# Patient Record
Sex: Male | Born: 1950 | Race: White | Hispanic: No | Marital: Married | Smoking: Never smoker
Health system: Southern US, Community
[De-identification: ages and names within clinical notes are randomized; demographics above are authoritative.]

## PROBLEM LIST (undated history)

## (undated) DIAGNOSIS — N183 Chronic kidney disease, stage 3 unspecified: Secondary | ICD-10-CM

## (undated) DIAGNOSIS — K760 Fatty (change of) liver, not elsewhere classified: Secondary | ICD-10-CM

## (undated) DIAGNOSIS — I4891 Unspecified atrial fibrillation: Secondary | ICD-10-CM

## (undated) DIAGNOSIS — I10 Essential (primary) hypertension: Secondary | ICD-10-CM

## (undated) DIAGNOSIS — E785 Hyperlipidemia, unspecified: Secondary | ICD-10-CM

## (undated) DIAGNOSIS — Z974 Presence of external hearing-aid: Secondary | ICD-10-CM

## (undated) DIAGNOSIS — G4733 Obstructive sleep apnea (adult) (pediatric): Secondary | ICD-10-CM

## (undated) DIAGNOSIS — E119 Type 2 diabetes mellitus without complications: Secondary | ICD-10-CM

## (undated) DIAGNOSIS — G4730 Sleep apnea, unspecified: Secondary | ICD-10-CM

## (undated) HISTORY — DX: Unspecified atrial fibrillation: I48.91

## (undated) HISTORY — PX: COLONOSCOPY: SHX174

## (undated) HISTORY — PX: VASECTOMY: SHX75

## (undated) HISTORY — DX: Sleep apnea, unspecified: G47.30

## (undated) HISTORY — DX: Hyperlipidemia, unspecified: E78.5

## (undated) HISTORY — PX: TONSILLECTOMY: SUR1361

## (undated) HISTORY — DX: Essential (primary) hypertension: I10

---

## 2003-12-15 ENCOUNTER — Ambulatory Visit (HOSPITAL_COMMUNITY): Admission: RE | Admit: 2003-12-15 | Discharge: 2003-12-15 | Payer: Self-pay | Admitting: Gastroenterology

## 2011-08-22 ENCOUNTER — Ambulatory Visit: Payer: Self-pay | Admitting: Internal Medicine

## 2011-10-12 ENCOUNTER — Encounter: Payer: Self-pay | Admitting: *Deleted

## 2011-10-12 NOTE — Telephone Encounter (Signed)
This encounter was created in error - please disregard.

## 2014-02-23 ENCOUNTER — Ambulatory Visit: Payer: Self-pay | Admitting: Gastroenterology

## 2014-02-24 LAB — PATHOLOGY REPORT

## 2017-01-29 NOTE — Progress Notes (Signed)
01/30/2017 2:12 PM   Nicholas Oliver. 1951/01/06 [id number]  Referring provider: No referring provider defined for this encounter.  Chief Complaint  Patient presents with  . New Patient (Initial Visit)    Hematuria referred by [name] PA    HPI: Patient is a 66 -year-old Caucasian male who presents today as a referral from their PCP, [name], PA , for gross hematuria.    Patient had one episode of gross hematuria at the end of August.      He does have a prior history of recurrent urinary tract infections, but he denies a history of nephrolithiasis, trauma to the genitourinary tract, BPH or malignancies of the genitourinary tract.  He does not have a family medical history of nephrolithiasis, malignancies of the genitourinary tract or hematuria.   Today, he is having symptoms of nocturia.  His UA today is negative.  He is not experiencing any suprapubic pain, abdominal pain or flank pain. He denies any recent fevers, chills, nausea or vomiting.   He has not had any recent imaging.      He is not a smoker.  He was exposed to second hand smoke as a child.  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   He has HTN.     He has a high BMI.     PMH: Past Medical History:  Diagnosis Date  . Atrial fibrillation (HCC)   . HLD (hyperlipidemia)   . HTN (hypertension)   . Sleep apnea     Surgical History: Past Surgical History:  Procedure Laterality Date  . VASECTOMY      Home Medications:  Allergies as of 01/30/2017   No Known Allergies     Medication List       Accurate as of 01/30/17  2:12 PM. Always use your most recent med list.          CO Q 10 PO Take by mouth daily.   lisinopril-hydrochlorothiazide 10-12.5 MG tablet Commonly known as:  PRINZIDE,ZESTORETIC   metoprolol succinate 100 MG 24 hr tablet Commonly known as:  TOPROL-XL Take by mouth.   rosuvastatin 10 MG tablet Commonly known as:  CRESTOR TAKE 1 TABLET  AT BEDTIME   XARELTO 20 MG Tabs tablet Generic drug:  rivaroxaban            Discharge Care Instructions        Start     Ordered   01/30/17 0000  Urinalysis, Complete     01/30/17 1341   01/30/17 0000  CULTURE, URINE COMPREHENSIVE     01/30/17 1341   01/30/17 0000  BUN+Creat     01/30/17 1341   01/30/17 0000  CT HEMATURIA WORKUP    Question Answer Comment  Reason for Exam (SYMPTOM  OR DIAGNOSIS REQUIRED) gross hematuria   Preferred imaging location? [facility]      01/30/17 1409      Allergies: No Known Allergies  Family History: Family History  Problem Relation Age of Onset  . Prostate cancer Neg Hx   . Kidney cancer Neg Hx   . Bladder Cancer Neg Hx     Social History:  reports that he has never smoked. He has never used smokeless tobacco. He reports that he does not drink alcohol or use drugs.  ROS: UROLOGY Frequent Urination?: No Hard to postpone urination?: No Burning/pain with urination?: No Get up at night to urinate?: Yes Leakage of urine?: No Urine stream starts and stops?:  No Trouble starting stream?: No Do you have to strain to urinate?: No Blood in urine?: Yes Urinary tract infection?: Yes Sexually transmitted disease?: No Injury to kidneys or bladder?: No Painful intercourse?: No Weak stream?: No Erection problems?: Yes Penile pain?: No  Gastrointestinal Nausea?: No Vomiting?: No Indigestion/heartburn?: No Diarrhea?: No Constipation?: No  Constitutional Fever: No Night sweats?: No Weight loss?: No Fatigue?: No  Skin Skin rash/lesions?: No Itching?: No  Eyes Blurred vision?: No Double vision?: No  Ears/Nose/Throat Sore throat?: No Sinus problems?: No  Hematologic/Lymphatic Swollen glands?: No Easy bruising?: No  Cardiovascular Leg swelling?: No Chest pain?: No  Respiratory Cough?: No Shortness of breath?: No  Endocrine Excessive thirst?: No  Musculoskeletal Back pain?: Yes Joint pain?:  No  Neurological Headaches?: No Dizziness?: No  Psychologic Depression?: No Anxiety?: No  Physical Exam: BP (!) 146/75   Pulse 73   Ht 6' (1.829 m)   Wt (!) 321 lb 6.4 oz (145.8 kg)   BMI 43.59 kg/m   Constitutional: Well nourished. Alert and oriented, No acute distress. HEENT: [place] AT, moist mucus membranes. Trachea midline, no masses. Cardiovascular: No clubbing, cyanosis, or edema. Respiratory: Normal respiratory effort, no increased work of breathing. GI: Abdomen is soft, non tender, non distended, no abdominal masses. Liver and spleen not palpable.  No hernias appreciated.  Stool sample for occult testing is not indicated.   GU: No CVA tenderness.  No bladder fullness or masses.  Patient with buried phallus.  Urethral meatus is patent.  No penile discharge. No penile lesions or rashes. Scrotum without lesions, cysts, rashes and/or edema.  Testicles are located scrotally bilaterally. No masses are appreciated in the testicles. Left and right epididymis are normal. Rectal: Patient with  normal sphincter tone. Anus and perineum without scarring or rashes. No rectal masses are appreciated. Prostate is approximately 45 grams, no nodules are appreciated. Seminal vesicles are normal. Skin: No rashes, bruises or suspicious lesions. Lymph: No cervical or inguinal adenopathy. Neurologic: Grossly intact, no focal deficits, moving all 4 extremities. Psychiatric: Normal mood and affect.  Laboratory Data: PSA history  0.58 in September 2015  0.52 in September 2016  3 urine cultures positive for Escherichia coli since May 2018.     Urinalysis Negative.  See EPIC.    I have reviewed the labs   Assessment & Plan:    1. Gross hematuria  - I explained to the patient that there are a number of causes that can be associated with blood in the urine, such as stones, BPH, UTI's, damage to the urinary tract and/or cancer.  - At this time, I felt that the patient warranted further urologic  evaluation.   The AUA guidelines state that a CT urogram is the preferred imaging study to evaluate hematuria.  - I explained to the patient that a contrast material will be injected into a vein and that in rare instances, an allergic reaction can result and may even life threatening   The patient denies any allergies to contrast, iodine and/or seafood and is not taking metformin.  - Following the imaging study,  I've recommended a cystoscopy. I described how this is performed, typically in an office setting with a flexible cystoscope. We described the risks, benefits, and possible side effects, the most common of which is a minor amount of blood in the urine and/or burning which usually resolves in 24 to 48 hours.    - The patient had the opportunity to ask questions which were answered. Based upon  this discussion, the patient is willing to proceed. Therefore, I've ordered: a CT Urogram and cystoscopy.  - The patient will return following all of the above for discussion of the results.   - UA  - Urine culture  - BUN + creatinine    2. BPH with LU TS  - PSA  3. History of recurrent UTI's  - asymptomatic at the time of the gross hematuria  - CTU and cystoscopy pending ? Nidus for infection  Return for CT Urogram report and cystoscopy.  These notes generated with voice recognition software. I apologize for typographical errors.  Michiel Cowboy, PA-C  [facility] [address], [address] [place], [place] [address] [phone]

## 2017-01-30 ENCOUNTER — Ambulatory Visit (INDEPENDENT_AMBULATORY_CARE_PROVIDER_SITE_OTHER): Admitting: Urology

## 2017-01-30 ENCOUNTER — Encounter: Payer: Self-pay | Admitting: Urology

## 2017-01-30 ENCOUNTER — Ambulatory Visit: Payer: Self-pay | Admitting: Urology

## 2017-01-30 VITALS — BP 146/75 | HR 73 | Ht 72.0 in | Wt 321.4 lb

## 2017-01-30 DIAGNOSIS — Z87440 Personal history of urinary (tract) infections: Secondary | ICD-10-CM

## 2017-01-30 DIAGNOSIS — N401 Enlarged prostate with lower urinary tract symptoms: Secondary | ICD-10-CM | POA: Diagnosis not present

## 2017-01-30 DIAGNOSIS — R310 Gross hematuria: Secondary | ICD-10-CM

## 2017-01-30 DIAGNOSIS — N138 Other obstructive and reflux uropathy: Secondary | ICD-10-CM

## 2017-01-30 LAB — URINALYSIS, COMPLETE
BILIRUBIN UA: NEGATIVE
Glucose, UA: NEGATIVE
Ketones, UA: NEGATIVE
Leukocytes, UA: NEGATIVE
NITRITE UA: NEGATIVE
PH UA: 6.5 (ref 5.0–7.5)
Protein, UA: NEGATIVE
Specific Gravity, UA: 1.02 (ref 1.005–1.030)
UUROB: 0.2 mg/dL (ref 0.2–1.0)

## 2017-01-30 LAB — MICROSCOPIC EXAMINATION: WBC UA: NONE SEEN /HPF (ref 0–?)

## 2017-02-01 LAB — BUN+CREAT
BUN/Creatinine Ratio: 12 (ref 10–24)
BUN: 12 mg/dL (ref 8–27)
Creatinine, Ser: 0.97 mg/dL (ref 0.76–1.27)
GFR calc non Af Amer: 82 mL/min/{1.73_m2} (ref >59)
GFR, EST AFRICAN AMERICAN: 95 mL/min/{1.73_m2} (ref >59)

## 2017-02-01 LAB — PSA: Prostate Specific Ag, Serum: 0.8 ng/mL (ref 0.0–4.0)

## 2017-02-02 LAB — CULTURE, URINE COMPREHENSIVE

## 2017-02-04 ENCOUNTER — Telehealth: Payer: Self-pay

## 2017-02-04 NOTE — Telephone Encounter (Signed)
Pt returned call and I told him his PSA was normal.

## 2017-02-04 NOTE — Telephone Encounter (Signed)
-----   Message from Harle Battiest, PA-C sent at 02/04/2017  7:53 AM EDT ----- Please let Mr. Bonura know that his PSA was normal.

## 2017-02-04 NOTE — Telephone Encounter (Signed)
Left pt mess 

## 2017-02-18 ENCOUNTER — Ambulatory Visit
Admission: RE | Admit: 2017-02-18 | Discharge: 2017-02-18 | Disposition: A | Discharge status: Home / Self Care | Source: Ambulatory Visit | Attending: Urology | Admitting: Urology

## 2017-02-18 DIAGNOSIS — R310 Gross hematuria: Secondary | ICD-10-CM | POA: Diagnosis present

## 2017-02-18 DIAGNOSIS — I700 Atherosclerosis of aorta: Secondary | ICD-10-CM | POA: Insufficient documentation

## 2017-02-18 DIAGNOSIS — K760 Fatty (change of) liver, not elsewhere classified: Secondary | ICD-10-CM | POA: Insufficient documentation

## 2017-02-18 DIAGNOSIS — I2510 Atherosclerotic heart disease of native coronary artery without angina pectoris: Secondary | ICD-10-CM | POA: Diagnosis not present

## 2017-02-18 MED ORDER — IOPAMIDOL (ISOVUE-300) INJECTION 61%
125.0000 mL | Freq: Once | INTRAVENOUS | Status: AC | PRN
  Administered 2017-02-18: 125 mL via INTRAVENOUS

## 2017-02-27 ENCOUNTER — Ambulatory Visit (INDEPENDENT_AMBULATORY_CARE_PROVIDER_SITE_OTHER): Admitting: Urology

## 2017-02-27 ENCOUNTER — Encounter: Payer: Self-pay | Admitting: Urology

## 2017-02-27 VITALS — BP 144/79 | HR 64 | Ht 72.0 in | Wt 316.9 lb

## 2017-02-27 DIAGNOSIS — R310 Gross hematuria: Secondary | ICD-10-CM

## 2017-02-27 LAB — URINALYSIS, COMPLETE
Bilirubin, UA: NEGATIVE
Glucose, UA: NEGATIVE
Ketones, UA: NEGATIVE
LEUKOCYTES UA: NEGATIVE
Nitrite, UA: NEGATIVE
PH UA: 5 (ref 5.0–7.5)
PROTEIN UA: NEGATIVE
SPEC GRAV UA: 1.025 (ref 1.005–1.030)
Urobilinogen, Ur: 0.2 mg/dL (ref 0.2–1.0)

## 2017-02-27 MED ORDER — LIDOCAINE HCL 2 % EX GEL
1.0000 "application " | Freq: Once | CUTANEOUS | Status: AC
  Administered 2017-02-27: 1 via URETHRAL

## 2017-02-27 MED ORDER — CIPROFLOXACIN HCL 500 MG PO TABS
500.0000 mg | ORAL_TABLET | Freq: Once | ORAL | Status: AC
  Administered 2017-02-27: 500 mg via ORAL

## 2017-02-27 NOTE — Progress Notes (Signed)
   02/27/17  CC: No chief complaint on file.   HPI: The patient is a 66 year old gentleman who presents today for completion of his gross hematuria workup. He had one episode of gross hematuria in August 2018. His CT was unremarkable for source of hematuria.  There were no vitals taken for this visit. NED. A&Ox3.   No respiratory distress   Abd soft, NT, ND Normal phallus with bilateral descended testicles  Cystoscopy Procedure Note  Patient identification was confirmed, informed consent was obtained, and patient was prepped using Betadine solution.  Lidocaine jelly was administered per urethral meatus.    Preoperative abx where received prior to procedure.     Pre-Procedure: - Inspection reveals a normal caliber ureteral meatus.  Procedure: The flexible cystoscope was introduced without difficulty - No urethral strictures/lesions are present. - Enlarged prostate Hypervascular. Visually obstructed. Approximate 4-5 cm in length. - Normal bladder neck - Bilateral ureteral orifices identified - Bladder mucosa  reveals no ulcers, tumors, or lesions - No bladder stones - No trabeculation  Retroflexion shows no intravesical lobe   Post-Procedure: - Patient tolerated the procedure well  Assessment/ Plan:  1.  Gross hematuria -negative workup. Likely secondary to a hypervascular prostate. If this becomes a recurrent, can consider a trial of finasteride. Follow-up in one year for repeat urinalysis.

## 2017-03-18 ENCOUNTER — Telehealth: Payer: Self-pay | Admitting: Urology

## 2017-03-18 NOTE — Telephone Encounter (Signed)
Spoke with Nicholas Oliver and made him aware of the CT findings of possible cirrhosis.  He will speak with his PCP regarding this finding.

## 2018-02-27 ENCOUNTER — Ambulatory Visit: Admitting: Urology

## 2018-06-04 IMAGING — CT CT ABD-PEL WO/W CM
2 of 6 series · 13 of 32 positions shown, 18 images · IV contrast (APPLIED)
Comparison: None.

CLINICAL DATA: Gross hematuria.

EXAM:
CT ABDOMEN AND PELVIS WITHOUT AND WITH CONTRAST
TECHNIQUE: Multidetector CT imaging of the abdomen and pelvis was performed
following the standard protocol before and following the bolus
administration of intravenous contrast.
CONTRAST:  125mL WPPSNU-DTT IOPAMIDOL (WPPSNU-DTT) INJECTION 61%

[Series 2: axial pre · axial · non-contrast · 0.98mm/px · z∈[-931,-541]mm · 7 of 106 slices shown]
[im 14/106  soft-tissue]
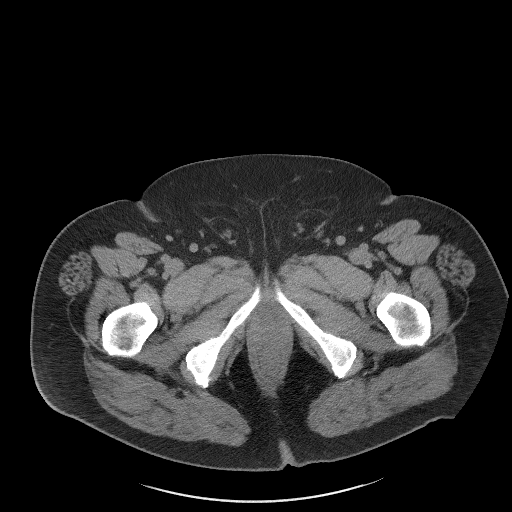
[im 27/106  soft-tissue]
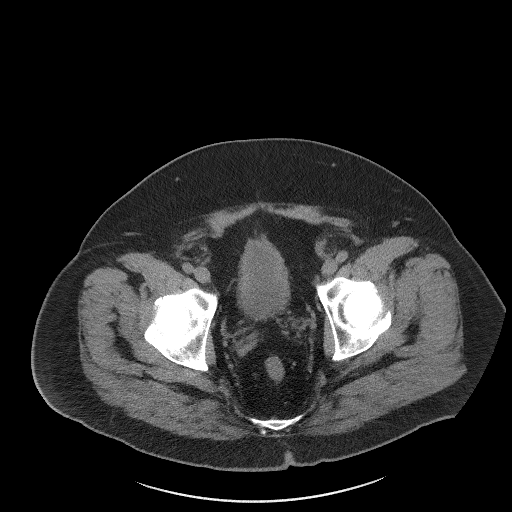
[im 40/106  soft-tissue]
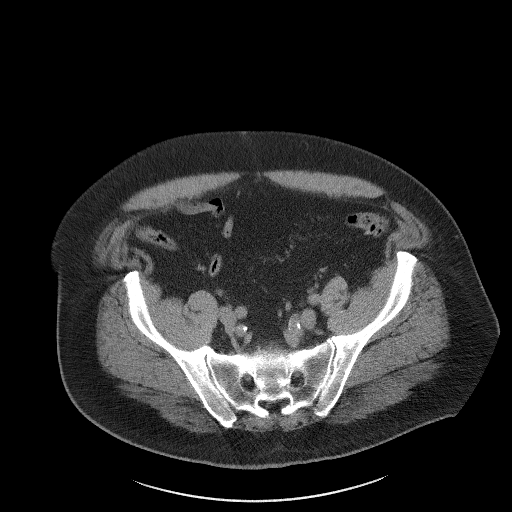
[im 53/106  soft-tissue]
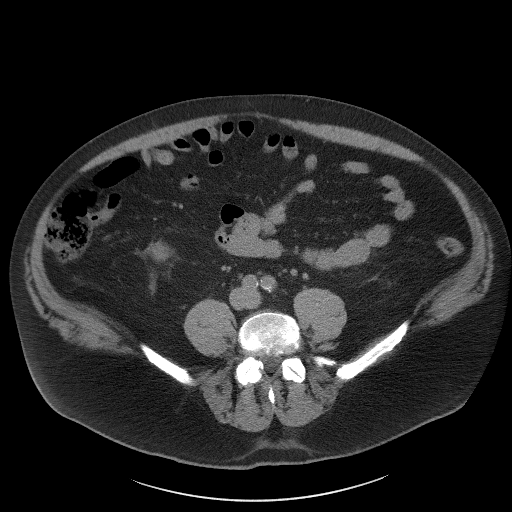
[im 66/106  soft-tissue]
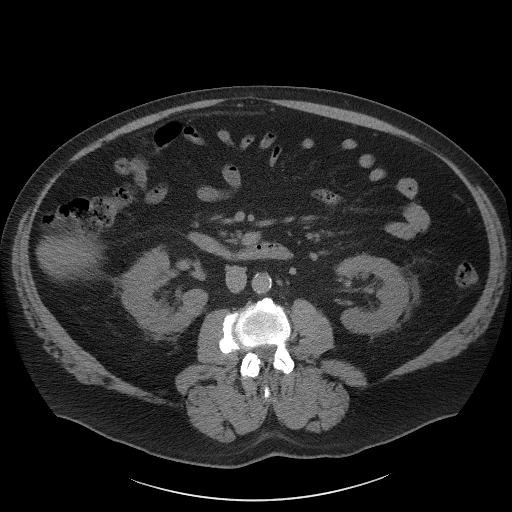
[im 79/106  soft-tissue]
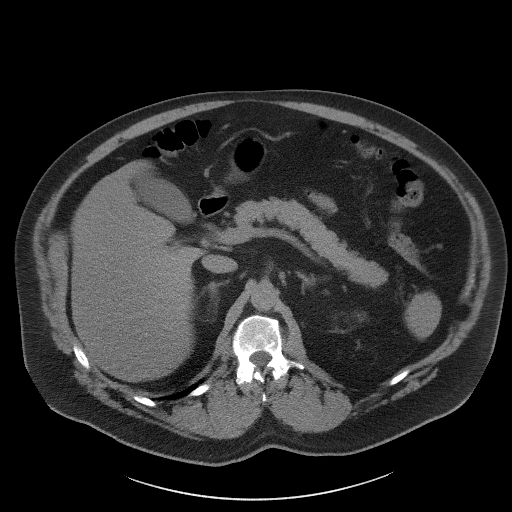
[im 92/106  soft-tissue]
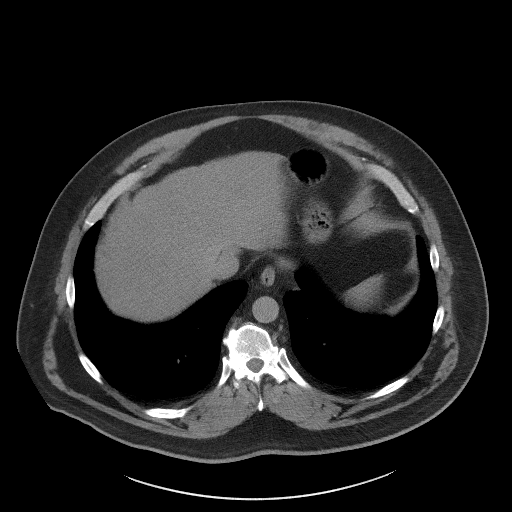

[Series 13: axial delay · axial · delayed · 0.98mm/px · z∈[-998,-593]mm · 6 of 115 slices shown, 11 images]
[im 17/115  soft-tissue]
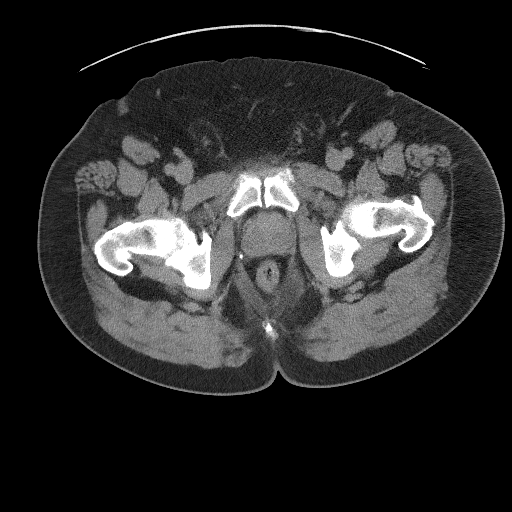
[im 17/115  bone]
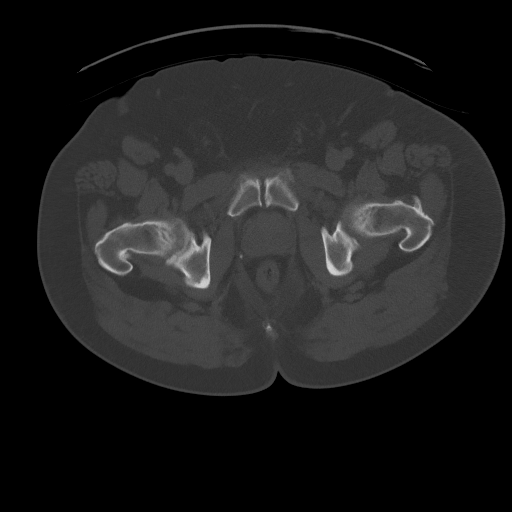
[im 33/115  soft-tissue]
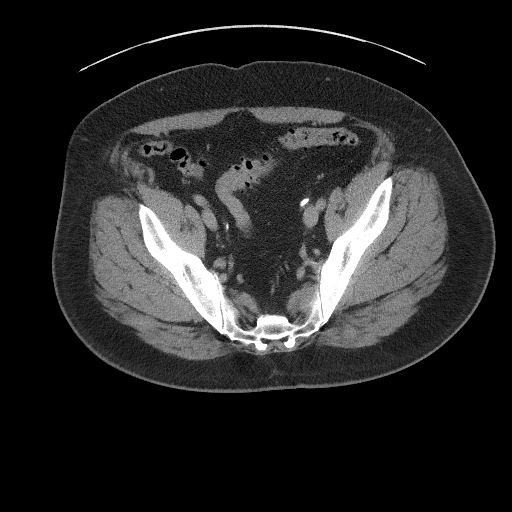
[im 49/115  soft-tissue]
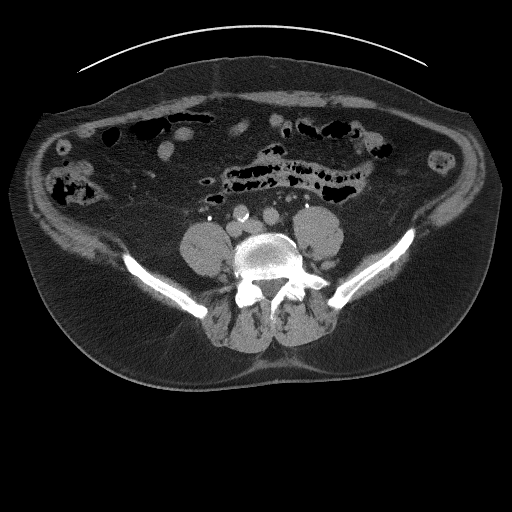
[im 49/115  lung]
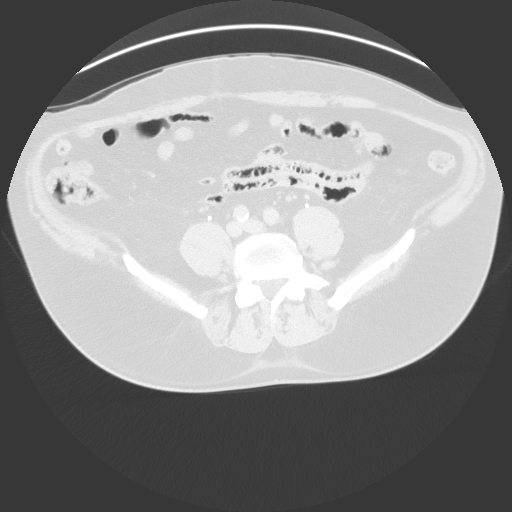
[im 66/115  soft-tissue]
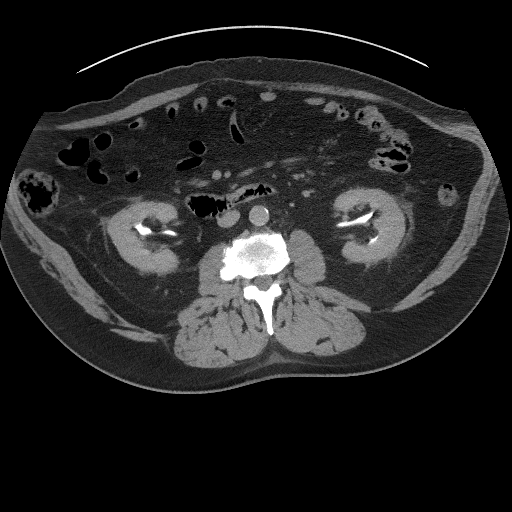
[im 66/115  lung]
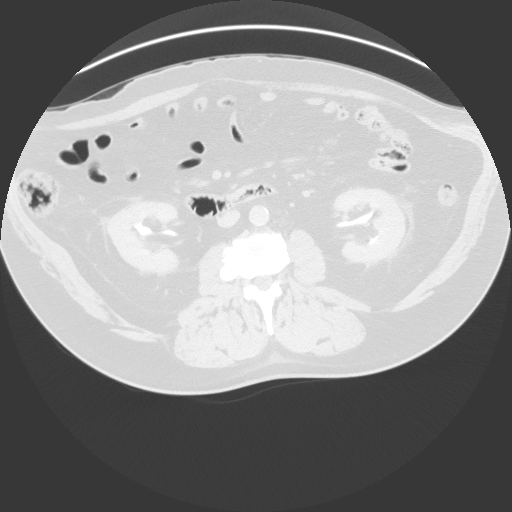
[im 82/115  soft-tissue]
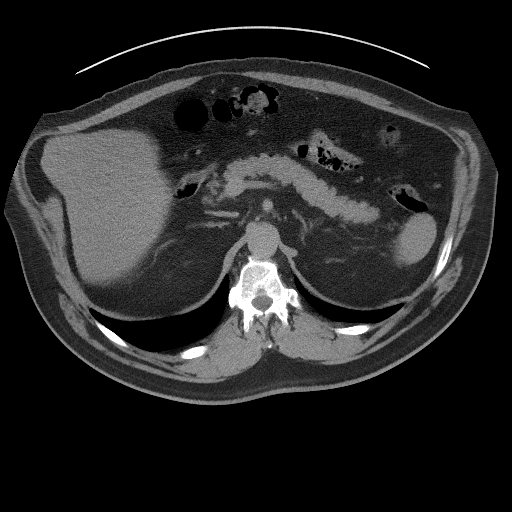
[im 82/115  lung]
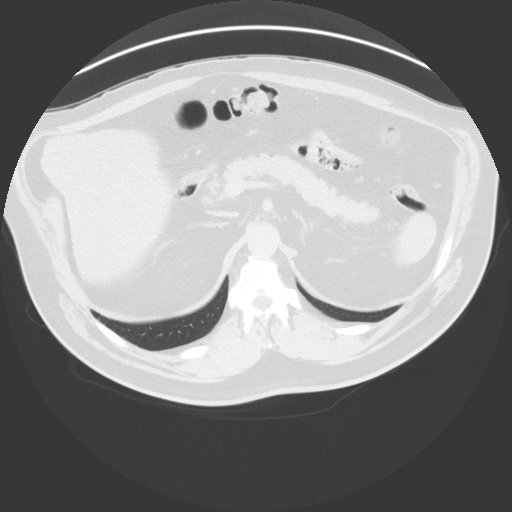
[im 98/115  soft-tissue]
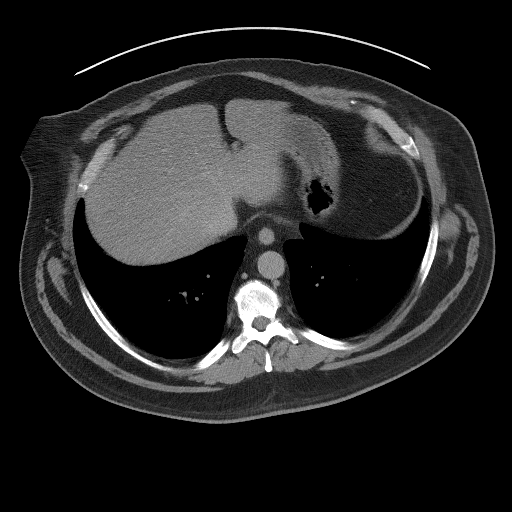
[im 98/115  lung]
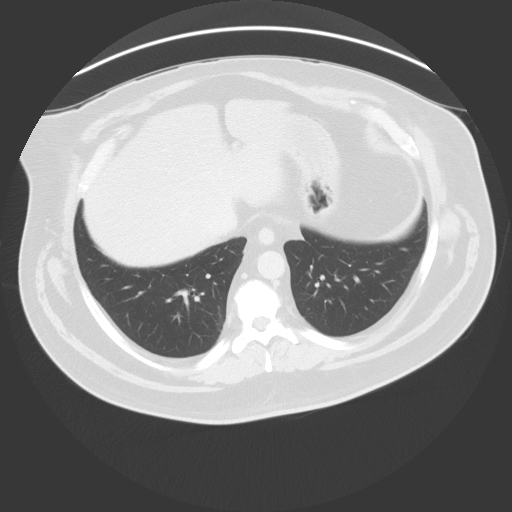

[13 of 32 positions shown; findings below may reference images not displayed]

FINDINGS: Lower chest: 2 mm left lower lobe nodule nonspecific. Coronary
artery calcification. Heart size normal. No pericardial or pleural
effusion.

Hepatobiliary: Liver appears slightly decreased in attenuation.
Liver contour is somewhat irregular. Subcentimeter low-attenuation
lesion in the right hepatic lobe is too small to characterize.
Gallbladder is unremarkable. No biliary ductal dilatation.

Pancreas: Negative.

Spleen: Negative.

Adrenals/Urinary Tract: Adrenal glands and kidneys are unremarkable.
No urinary stones. No filling defects in the intrarenal collecting
systems, ureters or bladder.

Stomach/Bowel: Stomach, small bowel and colon are unremarkable.
Appendix is not readily visualized.

Vascular/Lymphatic: Atherosclerotic calcification of the arterial
vasculature without abdominal aortic aneurysm. Porta hepatis lymph
node measures 1.5 cm, likely reactive.

Reproductive: Prostate is visualized.

Other: Small bilateral inguinal hernias contain fat. No free fluid.
Mesenteries and peritoneum are unremarkable.

Musculoskeletal: Degenerative changes in the spine. No worrisome
lytic or sclerotic lesions.
IMPRESSION: 1. No findings to explain the patient's hematuria.
2. Fatty liver.  Contour irregularity is indicative of cirrhosis.
3. Aortic atherosclerosis ([id number]). Coronary artery
calcification.

## 2018-12-16 ENCOUNTER — Other Ambulatory Visit: Payer: Self-pay

## 2018-12-16 ENCOUNTER — Encounter: Payer: Medicare Other | Attending: Internal Medicine | Admitting: *Deleted

## 2018-12-16 ENCOUNTER — Encounter: Payer: Self-pay | Admitting: *Deleted

## 2018-12-16 VITALS — BP 110/70 | Ht 72.0 in | Wt 284.2 lb

## 2018-12-16 DIAGNOSIS — E1165 Type 2 diabetes mellitus with hyperglycemia: Secondary | ICD-10-CM

## 2018-12-16 DIAGNOSIS — E119 Type 2 diabetes mellitus without complications: Secondary | ICD-10-CM | POA: Insufficient documentation

## 2018-12-16 NOTE — Progress Notes (Signed)
Diabetes Self-Management Education  Visit Type: First/Initial  Appt. Start Time: 0905 Appt. End Time: 5366  12/16/2018  Mr. Nicholas Oliver, identified by name and date of birth, is a 68 y.o. male with a diagnosis of Diabetes: Type 2.   ASSESSMENT  Blood pressure 110/70, height 6' (1.829 m), weight 284 lb 3.2 oz (128.9 kg). Body mass index is 38.54 kg/m.  Diabetes Self-Management Education - 12/16/18 1054      Visit Information   Visit Type  First/Initial      Initial Visit   Diabetes Type  Type 2    Are you currently following a meal plan?  No    Are you taking your medications as prescribed?  Yes   waiting on pen needles for Lantus and Metformin from [place].   Date Diagnosed  Pt reports "1 week".A1C was 7.5 % in 2018      Health Coping   How would you rate your overall health?  Good      Psychosocial Assessment   Patient Belief/Attitude about Diabetes  Nicholas (comment)   "I don't want to take any more medication."   Self-care barriers  None    Self-management support  Doctor's office;Family    Patient Concerns  Nutrition/Meal planning;Glycemic Control    Special Needs  None    Preferred Learning Style  Auditory    Learning Readiness  Ready    How often do you need to have someone help you when you read instructions, pamphlets, or Nicholas written materials from your doctor or pharmacy?  1 - Never    What is the last grade level you completed in school?  doctorate      Pre-Education Assessment   Patient understands the diabetes disease and treatment process.  Needs Instruction    Patient understands incorporating nutritional management into lifestyle.  Needs Instruction    Patient undertands incorporating physical activity into lifestyle.  Needs Instruction    Patient understands using medications safely.  Needs Instruction    Patient understands monitoring blood glucose, interpreting and using results  Needs Instruction    Patient understands prevention, detection,  and treatment of acute complications.  Needs Instruction    Patient understands prevention, detection, and treatment of chronic complications.  Needs Instruction    Patient understands how to develop strategies to address psychosocial issues.  Needs Instruction    Patient understands how to develop strategies to promote health/change behavior.  Needs Instruction      Complications   Last HgB A1C per patient/outside source  13.8 %   12/01/18   How often do you check your blood sugar?  0 times/day (not testing)   Pt has FreeStyle Libre at home but hasn't started using. BG in the office today was 401 mg/dL at 10:25 am - 2 hrs pp. He will be starting Insulin today.   Have you had a dilated eye exam in the past 12 months?  Yes    Have you had a dental exam in the past 12 months?  Yes    Are you checking your feet?  Yes    How many days per week are you checking your feet?  6      Dietary Intake   Breakfast  English Muffin with butter or preserves; ham biscuit; eggs and bacon    Lunch  sandwich, soup, salmon and sweet potato, pizza, left overs, fruit, meatball sub, chicken sandwich - beef, chicken, beans, broccoli, salads    Dinner  small - fruit  Snack (evening)  ice cream    Beverage(s)  water, juice, sweet tea once a week      Exercise   Exercise Type  Light (walking / raking leaves)    How many days per week to you exercise?  5    How many minutes per day do you exercise?  30    Total minutes per week of exercise  150      Patient Education   Previous Diabetes Education  No    Disease state   Definition of diabetes, type 1 and 2, and the diagnosis of diabetes;Factors that contribute to the development of diabetes    Nutrition management   Role of diet in the treatment of diabetes and the relationship between the three main macronutrients and blood glucose level;Reviewed blood glucose goals for pre and post meals and how to evaluate the patients' food intake on their blood glucose level.     Physical activity and exercise   Role of exercise on diabetes management, blood pressure control and cardiac health.    Medications  Taught/reviewed insulin injection, site rotation, insulin storage and needle disposal.;Reviewed patients medication for diabetes, action, purpose, timing of dose and side effects. Pt injected 5 units NS subcutaneously via syringe to left abdomen without difficulty.    Monitoring  Purpose and frequency of SMBG.;Taught/discussed recording of test results and interpretation of SMBG.;Identified appropriate SMBG and/or A1C goals.    Acute complications  Taught treatment of hypoglycemia - the 15 rule.    Chronic complications  Relationship between chronic complications and blood glucose control    Psychosocial adjustment  Identified and addressed patients feelings and concerns about diabetes      Individualized Goals (developed by patient)   Reducing Risk  Improve blood sugars     Outcomes   Expected Outcomes  Demonstrated interest in learning. Expect positive outcomes    Program Status  Not Completed       Individualized Plan for Diabetes Self-Management Training:   Learning Objective:  Patient will have a greater understanding of diabetes self-management. Patient education plan is to attend individual and/or group sessions per assessed needs and concerns.   Plan:   Patient Instructions  Check blood sugars at least  2 x day before breakfast and 2 hrs after one meal every day or as needed with FreeStyle Libre Exercise: Continue walking for 30 minutes 5  days a week Eat 3 meals day, 1-2 snacks a day Space meals 4-6 hours apart Avoid sugar sweetened drinks (tea, juices) unless treating a low blood sugar Carry fast acting glucose and a snack at all times Rotate injection sites Call back if you want to schedule classes  Expected Outcomes:  Demonstrated interest in learning. Expect positive outcomes  Education material provided:  General Meal Planning  Guidelines Simple Meal Plan BD Insulin start kit with pen needles Glucose tablets Symptoms, causes and treatments of Hypoglycemia  If problems or questions, patient to contact team via:  Nicholas Oliver, [place], [place], CDE [phone]  Future DSME appointment:  Patient doesn't want to schedule any further education at this time.

## 2018-12-16 NOTE — Patient Instructions (Signed)
Check blood sugars at least  2 x day before breakfast and 2 hrs after one meal every day or as needed with FreeStyle Libre  Exercise: Continue walking for 30 minutes 5  days a week  Eat 3 meals day, 1-2 snacks a day Space meals 4-6 hours apart Avoid sugar sweetened drinks (tea, juices) unless treating a low blood sugar  Carry fast acting glucose and a snack at all times Rotate injection sites  Call back if you want to schedule classes

## 2019-04-24 ENCOUNTER — Other Ambulatory Visit
Admission: RE | Admit: 2019-04-24 | Discharge: 2019-04-24 | Disposition: A | Discharge status: Home / Self Care | Payer: Medicare Other | Source: Ambulatory Visit | Attending: Internal Medicine | Admitting: Internal Medicine

## 2019-04-24 DIAGNOSIS — Z01812 Encounter for preprocedural laboratory examination: Secondary | ICD-10-CM | POA: Insufficient documentation

## 2019-04-24 DIAGNOSIS — Z20828 Contact with and (suspected) exposure to other viral communicable diseases: Secondary | ICD-10-CM | POA: Insufficient documentation

## 2019-04-24 LAB — SARS CORONAVIRUS 2 (TAT 6-24 HRS): SARS Coronavirus 2: NEGATIVE

## 2019-04-28 ENCOUNTER — Encounter: Payer: Self-pay | Admitting: Internal Medicine

## 2019-04-29 ENCOUNTER — Encounter
Admission: RE | Disposition: A | Discharge status: Home / Self Care | Payer: Self-pay | Source: Home / Self Care | Attending: Internal Medicine

## 2019-04-29 ENCOUNTER — Ambulatory Visit: Payer: Medicare Other | Admitting: Anesthesiology

## 2019-04-29 ENCOUNTER — Encounter: Payer: Self-pay | Admitting: Internal Medicine

## 2019-04-29 ENCOUNTER — Ambulatory Visit
Admission: RE | Admit: 2019-04-29 | Discharge: 2019-04-29 | Disposition: A | Discharge status: Home / Self Care | Payer: Medicare Other | Attending: Internal Medicine | Admitting: Internal Medicine

## 2019-04-29 DIAGNOSIS — Z8371 Family history of colonic polyps: Secondary | ICD-10-CM | POA: Insufficient documentation

## 2019-04-29 DIAGNOSIS — I10 Essential (primary) hypertension: Secondary | ICD-10-CM | POA: Diagnosis not present

## 2019-04-29 DIAGNOSIS — Z794 Long term (current) use of insulin: Secondary | ICD-10-CM | POA: Diagnosis not present

## 2019-04-29 DIAGNOSIS — E785 Hyperlipidemia, unspecified: Secondary | ICD-10-CM | POA: Insufficient documentation

## 2019-04-29 DIAGNOSIS — I4891 Unspecified atrial fibrillation: Secondary | ICD-10-CM | POA: Insufficient documentation

## 2019-04-29 DIAGNOSIS — Z79899 Other long term (current) drug therapy: Secondary | ICD-10-CM | POA: Insufficient documentation

## 2019-04-29 DIAGNOSIS — K621 Rectal polyp: Secondary | ICD-10-CM | POA: Diagnosis not present

## 2019-04-29 DIAGNOSIS — Z1211 Encounter for screening for malignant neoplasm of colon: Secondary | ICD-10-CM | POA: Insufficient documentation

## 2019-04-29 DIAGNOSIS — G4730 Sleep apnea, unspecified: Secondary | ICD-10-CM | POA: Insufficient documentation

## 2019-04-29 DIAGNOSIS — K640 First degree hemorrhoids: Secondary | ICD-10-CM | POA: Diagnosis not present

## 2019-04-29 DIAGNOSIS — K5730 Diverticulosis of large intestine without perforation or abscess without bleeding: Secondary | ICD-10-CM | POA: Insufficient documentation

## 2019-04-29 DIAGNOSIS — Z7901 Long term (current) use of anticoagulants: Secondary | ICD-10-CM | POA: Diagnosis not present

## 2019-04-29 DIAGNOSIS — E119 Type 2 diabetes mellitus without complications: Secondary | ICD-10-CM | POA: Insufficient documentation

## 2019-04-29 HISTORY — DX: Type 2 diabetes mellitus without complications: E11.9

## 2019-04-29 HISTORY — PX: COLONOSCOPY WITH PROPOFOL: SHX5780

## 2019-04-29 LAB — GLUCOSE, CAPILLARY: Glucose-Capillary: 117 mg/dL — ABNORMAL HIGH (ref 70–99)

## 2019-04-29 SURGERY — COLONOSCOPY WITH PROPOFOL
Anesthesia: General

## 2019-04-29 MED ORDER — PROPOFOL 10 MG/ML IV BOLUS
INTRAVENOUS | Status: DC | PRN
  Administered 2019-04-29 (×2): 30 mg via INTRAVENOUS
  Administered 2019-04-29: 70 mg via INTRAVENOUS
  Administered 2019-04-29: 50 mg via INTRAVENOUS

## 2019-04-29 MED ORDER — SODIUM CHLORIDE 0.9 % IV SOLN: INTRAVENOUS | Status: DC

## 2019-04-29 MED ORDER — PROPOFOL 10 MG/ML IV BOLUS
INTRAVENOUS | Status: AC
  Filled 2019-04-29: qty 40

## 2019-04-29 NOTE — Anesthesia Post-op Follow-up Note (Signed)
Anesthesia QCDR form completed.        

## 2019-04-29 NOTE — Anesthesia Postprocedure Evaluation (Signed)
Anesthesia Post Note  Patient: Nicholas Oliver.  Procedure(s) Performed: COLONOSCOPY WITH PROPOFOL (N/A )  Patient location during evaluation: Endoscopy Anesthesia Type: General Level of consciousness: awake and alert Pain management: pain level controlled Vital Signs Assessment: post-procedure vital signs reviewed and stable Respiratory status: spontaneous breathing, nonlabored ventilation, respiratory function stable and patient connected to nasal cannula oxygen Cardiovascular status: blood pressure returned to baseline and stable Postop Assessment: no apparent nausea or vomiting Anesthetic complications: no     Last Vitals:  Vitals:   04/29/19 0906 04/29/19 0916  BP: 121/69 119/74  Pulse: 66 63  Resp: (!) 21 15  Temp:    SpO2: 96% 97%    Last Pain:  Vitals:   04/29/19 0926  TempSrc:   PainSc: 0-No pain                 Martha Clan

## 2019-04-29 NOTE — H&P (Signed)
Outpatient short stay form Pre-procedure 04/29/2019 8:37 AM Nicholas Oliver, M.D.  Primary Physician:  Frazier Richards, M.D.  Reason for visit:  Family history of colon polyps  History of present illness:   68 year old patient presenting for family history of colon polyps. Patient denies any change in bowel habits, rectal bleeding or involuntary weight loss.     Current Facility-Administered Medications:  .  0.9 %  sodium chloride infusion, , Intravenous, Continuous, [place], Benay Pike, MD, Last Rate: 20 mL/hr at 04/29/19 0750, New Bag at 04/29/19 0750  Medications Prior to Admission  Medication Sig Dispense Refill Last Dose  . lisinopril-hydrochlorothiazide (PRINZIDE,ZESTORETIC) 10-12.5 MG tablet Take 1 tablet by mouth daily.    04/29/2019 at 0600  . metFORMIN (GLUCOPHAGE-XR) 500 MG 24 hr tablet Take 1,000 mg by mouth daily.   04/28/2019 at Unknown time  . metoprolol succinate (TOPROL-XL) 100 MG 24 hr tablet Take 100 mg by mouth daily.    04/28/2019 at Unknown time  . rosuvastatin (CRESTOR) 10 MG tablet TAKE 1 TABLET AT BEDTIME   04/28/2019 at Unknown time  . Coenzyme Q10 (CO Q 10 PO) Take 1 tablet by mouth daily.      . Continuous Blood Gluc Receiver (FREESTYLE LIBRE 14 DAY READER) DEVI See admin instructions.     . Continuous Blood Gluc Sensor (FREESTYLE LIBRE 14 DAY SENSOR) MISC USE EVERY 14 DAYS FOR GLUCOSE MONITORING     . Insulin Glargine (LANTUS SOLOSTAR) 100 UNIT/ML Solostar Pen Inject 25 Units into the skin at bedtime.   04/27/2019  . nystatin cream (MYCOSTATIN) Apply 1 application topically 2 (two) times daily.     . rivaroxaban (XARELTO) 20 MG TABS tablet 20 mg.    04/24/2019     No Known Allergies   Past Medical History:  Diagnosis Date  . Atrial fibrillation ([place])   . Diabetes mellitus without complication ([place])   . HLD (hyperlipidemia)   . HTN (hypertension)   . Sleep apnea     Review of systems:  Otherwise negative.    Physical Exam  Gen: Alert,  oriented. Appears stated age.  HEENT: [place]/AT. PERRLA. Lungs: CTA, no wheezes. CV: RR nl S1, S2. Abd: soft, benign, no masses. BS+ Ext: No edema. Pulses 2+    Planned procedures: Proceed with colonoscopy. The patient understands the nature of the planned procedure, indications, risks, alternatives and potential complications including but not limited to bleeding, infection, perforation, damage to internal organs and possible oversedation/side effects from anesthesia. The patient agrees and gives consent to proceed.  Please refer to procedure notes for findings, recommendations and patient disposition/instructions.     Gomer France K. Alice Oliver, M.D. Gastroenterology 04/29/2019  8:37 AM

## 2019-04-29 NOTE — Anesthesia Preprocedure Evaluation (Signed)
Anesthesia Evaluation  Patient identified by MRN, date of birth, ID band Patient awake    Reviewed: Allergy & Precautions, H&P , NPO status , Patient's Chart, lab work & pertinent test results, reviewed documented beta blocker date and time   History of Anesthesia Complications Negative for: history of anesthetic complications  Airway Mallampati: II  TM Distance: >3 FB Neck ROM: full    Dental  (+) Caps, Dental Advidsory Given, Teeth Intact   Pulmonary neg shortness of breath, sleep apnea , neg COPD, neg recent URI,    Pulmonary exam normal        Cardiovascular Exercise Tolerance: Good hypertension, (-) angina(-) Past MI and (-) Cardiac Stents Normal cardiovascular exam+ dysrhythmias Atrial Fibrillation (-) Valvular Problems/Murmurs     Neuro/Psych negative neurological ROS  negative psych ROS   GI/Hepatic negative GI ROS, Neg liver ROS,   Endo/Other  diabetesMorbid obesity  Renal/GU negative Renal ROS  negative genitourinary   Musculoskeletal   Abdominal   Peds  Hematology negative hematology ROS (+)   Anesthesia Other Findings Past Medical History: No date: Atrial fibrillation (HCC) No date: Diabetes mellitus without complication (HCC) No date: HLD (hyperlipidemia) No date: HTN (hypertension) No date: Sleep apnea   Reproductive/Obstetrics negative OB ROS                             Anesthesia Physical Anesthesia Plan  ASA: III  Anesthesia Plan: General   Post-op Pain Management:    Induction: Intravenous  PONV Risk Score and Plan: 2 and Propofol infusion and TIVA  Airway Management Planned: Natural Airway and Nasal Cannula  Additional Equipment:   Intra-op Plan:   Post-operative Plan:   Informed Consent: I have reviewed the patients History and Physical, chart, labs and discussed the procedure including the risks, benefits and alternatives for the proposed anesthesia  with the patient or authorized representative who has indicated his/her understanding and acceptance.     Dental Advisory Given  Plan Discussed with: Anesthesiologist, CRNA and Surgeon  Anesthesia Plan Comments:         Anesthesia Quick Evaluation

## 2019-04-29 NOTE — Op Note (Signed)
[facility] Gastroenterology Patient Name: Nicholas Oliver Procedure Date: 04/29/2019 7:59 AM MRN: [id number] Account #: [id number] Date of Birth: 02/03/1951 Admit Type: Outpatient Age: 68 Room: [facility] ENDO ROOM 2 Gender: Male Note Status: Finalized Procedure:             Colonoscopy Indications:           Colon cancer screening in patient at increased risk:                         Family history of 1st-degree relative with colon polyps Providers:             Benay Pike. Harjot Dibello MD, MD Medicines:             Propofol per Anesthesia Complications:         No immediate complications. Procedure:             Pre-Anesthesia Assessment:                        - The risks and benefits of the procedure and the                         sedation options and risks were discussed with the                         patient. All questions were answered and informed                         consent was obtained.                        - Patient identification and proposed procedure were                         verified prior to the procedure by the nurse. The                         procedure was verified in the procedure room.                        - ASA Grade Assessment: III - A patient with severe                         systemic disease.                        - After reviewing the risks and benefits, the patient                         was deemed in satisfactory condition to undergo the                         procedure.                        After obtaining informed consent, the colonoscope was                         passed under direct vision. Throughout the procedure,  the patient's blood pressure, pulse, and oxygen                         saturations were monitored continuously. The                         Colonoscope was introduced through the anus and                         advanced to the the cecum, identified by appendiceal   orifice and ileocecal valve. The colonoscopy was                         performed without difficulty. The patient tolerated                         the procedure well. The quality of the bowel                         preparation was good. The ileocecal valve, appendiceal                         orifice, and rectum were photographed. Findings:      The perianal and digital rectal examinations were normal. Pertinent       negatives include normal sphincter tone and no palpable rectal lesions.      Non-bleeding internal hemorrhoids were found during retroflexion. The       hemorrhoids were Grade I (internal hemorrhoids that do not prolapse).      Scattered small and large-mouthed diverticula were found in the sigmoid       colon.      A 3 mm polyp was found in the rectum. The polyp was sessile. The polyp       was removed with a jumbo cold forceps. Resection and retrieval were       complete.      The exam was otherwise without abnormality. Impression:            - Non-bleeding internal hemorrhoids.                        - Diverticulosis in the sigmoid colon.                        - One 3 mm polyp in the rectum, removed with a jumbo                         cold forceps. Resected and retrieved.                        - The examination was otherwise normal. Recommendation:        - Patient has a contact number available for                         emergencies. The signs and symptoms of potential                         delayed complications were discussed with the patient.  Return to normal activities tomorrow. Written                         discharge instructions were provided to the patient.                        - Resume previous diet.                        - Continue present medications.                        - Repeat colonoscopy in 5 years for screening purposes.                        - Return to GI office PRN.                        - The findings and  recommendations were discussed with                         the patient. Procedure Code(s):     --- Professional ---                        [phone], Colonoscopy, flexible; with biopsy, single or                         multiple Diagnosis Code(s):     --- Professional ---                        K57.30, Diverticulosis of large intestine without                         perforation or abscess without bleeding                        K62.1, Rectal polyp                        K64.0, First degree hemorrhoids                        Z83.71, Family history of colonic polyps CPT copyright 2019 American [employer] Association. All rights reserved. The codes documented in this report are preliminary and upon coder review may  be revised to meet current compliance requirements. Stanton Kidney MD, MD 04/29/2019 8:54:03 AM This report has been signed electronically. Number of Addenda: 0 Note Initiated On: 04/29/2019 7:59 AM Scope Withdrawal Time: 0 hours 6 minutes 48 seconds  Total Procedure Duration: 0 hours 8 minutes 6 seconds  Estimated Blood Loss:  Estimated blood loss: none.      [facility]

## 2019-04-29 NOTE — Transfer of Care (Signed)
Immediate Anesthesia Transfer of Care Note  Patient: Nicholas Oliver.  Procedure(s) Performed: COLONOSCOPY WITH PROPOFOL (N/A )  Patient Location: Endoscopy Unit  Anesthesia Type:General  Level of Consciousness: awake, alert  and oriented  Airway & Oxygen Therapy: Patient Spontanous Breathing  Post-op Assessment: Report given to RN and Post -op Vital signs reviewed and stable  Post vital signs: Reviewed and stable  Last Vitals:  Vitals Value Taken Time  BP 101/73 04/29/19 0856  Temp    Pulse 71 04/29/19 0856  Resp 10 04/29/19 0856  SpO2 94 % 04/29/19 0856  Vitals shown include unvalidated device data.  Last Pain:  Vitals:   04/29/19 0730  TempSrc: Temporal  PainSc: 0-No pain         Complications: No apparent anesthesia complications

## 2019-04-30 ENCOUNTER — Encounter: Payer: Self-pay | Admitting: *Deleted

## 2019-04-30 LAB — SURGICAL PATHOLOGY

## 2019-06-20 ENCOUNTER — Ambulatory Visit: Payer: Medicare Other | Attending: Internal Medicine

## 2019-06-20 DIAGNOSIS — Z23 Encounter for immunization: Secondary | ICD-10-CM

## 2019-06-20 NOTE — Progress Notes (Signed)
   [id number]  Name:  Nicholas Oliver.    MRN: [id number] DOB: Sep 20, 1950  06/20/2019  Mr. Nicholas Oliver was observed post Covid-19 immunization for 30 minutes based on pre-vaccination screening without incidence. He was provided with Vaccine Information Sheet and instruction to access the V-Safe system.   Mr. Nicholas Oliver was instructed to call 911 with any severe reactions post vaccine: Marland Kitchen Difficulty breathing  . Swelling of your face and throat  . A fast heartbeat  . A bad rash all over your body  . Dizziness and weakness    Immunizations Administered    Name Date Dose VIS Date Route   Pfizer COVID-19 Vaccine 06/20/2019  1:00 AM 0.3 mL 04/24/2019 Intramuscular   Manufacturer: [employer], [employer]   Lot: [id number]   NDC: [address]-7

## 2019-07-14 ENCOUNTER — Ambulatory Visit: Payer: Medicare Other | Attending: Internal Medicine

## 2019-07-14 DIAGNOSIS — Z23 Encounter for immunization: Secondary | ICD-10-CM

## 2019-07-14 NOTE — Progress Notes (Signed)
   [id number]  Name:  Abdulkareem Badolato.    MRN: [id number] DOB: 1950-09-13  07/14/2019  Mr. Llerena was observed post Covid-19 immunization for 30 minutes based on pre-vaccination screening without incident. He was provided with Vaccine Information Sheet and instruction to access the V-Safe system.   Mr. Hiller was instructed to call 911 with any severe reactions post vaccine: Marland Kitchen Difficulty breathing  . Swelling of face and throat  . A fast heartbeat  . A bad rash all over body  . Dizziness and weakness   Immunizations Administered    Name Date Dose VIS Date Route   Pfizer COVID-19 Vaccine 07/14/2019  9:04 AM 0.3 mL 04/24/2019 Intramuscular   Manufacturer: [employer], [employer]   Lot: [id number]   NDC: [address]-7

## 2021-10-19 ENCOUNTER — Encounter: Payer: Self-pay | Admitting: *Deleted

## 2023-03-21 ENCOUNTER — Encounter: Payer: Self-pay | Admitting: Ophthalmology

## 2023-03-22 ENCOUNTER — Encounter: Payer: Self-pay | Admitting: Ophthalmology

## 2023-03-22 NOTE — Anesthesia Preprocedure Evaluation (Addendum)
Anesthesia Evaluation  Patient identified by MRN, date of birth, ID band Patient awake    Reviewed: Allergy & Precautions, H&P , NPO status , Patient's Chart, lab work & pertinent test results  Airway Mallampati: III  TM Distance: >3 FB Neck ROM: Full   Comment: Thick neck, OSA on CPAP, brought CPAP with him Dental no notable dental hx.    Pulmonary neg pulmonary ROS, sleep apnea    Pulmonary exam normal breath sounds clear to auscultation       Cardiovascular hypertension, negative cardio ROS Normal cardiovascular exam Rhythm:Regular Rate:Normal  Cannot find echo  on Epic   Neuro/Psych negative neurological ROS  negative psych ROS   GI/Hepatic negative GI ROS, Neg liver ROS,,,  Endo/Other  negative endocrine ROSdiabetes    Renal/GU Renal diseasenegative Renal ROS  negative genitourinary   Musculoskeletal negative musculoskeletal ROS (+)    Abdominal   Peds negative pediatric ROS (+)  Hematology negative hematology ROS (+)   Anesthesia Other Findings Atrial fibrillation   HTN (hypertension) HLD (hyperlipidemia)  Sleep apnea Diabetes mellitus without complication Wears hearing aid in both ears Hypercholesterolemia  ED (erectile dysfunction)  OSA on CPAP  Health care maintenance  Controlled type 2 diabetes mellitus with stage 3 chronic kidney disease, without long-term current use of insulin (CMS/HHS-HCC)  Morbid obesity with BMI of 40.0-44.9 (HCC)  Atherosclerosis of abdominal aorta (CMS-HCC)  Fatty liver  DDD (degenerative disc disease), thoracolumbar  B12 deficiency  Morbid obesity with BMI of 40.0-44.9   Reproductive/Obstetrics negative OB ROS                             Anesthesia Physical Anesthesia Plan  ASA: 3  Anesthesia Plan: MAC   Post-op Pain Management:    Induction: Intravenous  PONV Risk Score and Plan:   Airway Management Planned: Natural Airway and Nasal  Cannula  Additional Equipment:   Intra-op Plan:   Post-operative Plan:   Informed Consent: I have reviewed the patients History and Physical, chart, labs and discussed the procedure including the risks, benefits and alternatives for the proposed anesthesia with the patient or authorized representative who has indicated his/her understanding and acceptance.     Dental Advisory Given  Plan Discussed with: Anesthesiologist, CRNA and Surgeon  Anesthesia Plan Comments: (Patient consented for risks of anesthesia including but not limited to:  - adverse reactions to medications - damage to eyes, teeth, lips or other oral mucosa - nerve damage due to positioning  - sore throat or hoarseness - Damage to heart, brain, nerves, lungs, other parts of body or loss of life  Patient voiced understanding and assent.)       Anesthesia Quick Evaluation

## 2023-03-25 NOTE — Discharge Instructions (Signed)

## 2023-03-27 ENCOUNTER — Ambulatory Visit: Payer: Medicare Other | Admitting: Anesthesiology

## 2023-03-27 ENCOUNTER — Other Ambulatory Visit: Payer: Self-pay

## 2023-03-27 ENCOUNTER — Ambulatory Visit
Admission: RE | Admit: 2023-03-27 | Discharge: 2023-03-27 | Disposition: A | Discharge status: Home / Self Care | Payer: Medicare Other | Attending: Ophthalmology | Admitting: Ophthalmology

## 2023-03-27 ENCOUNTER — Encounter
Admission: RE | Disposition: A | Discharge status: Home / Self Care | Payer: Self-pay | Source: Home / Self Care | Attending: Ophthalmology

## 2023-03-27 DIAGNOSIS — E1136 Type 2 diabetes mellitus with diabetic cataract: Secondary | ICD-10-CM | POA: Diagnosis present

## 2023-03-27 DIAGNOSIS — I129 Hypertensive chronic kidney disease with stage 1 through stage 4 chronic kidney disease, or unspecified chronic kidney disease: Secondary | ICD-10-CM | POA: Diagnosis not present

## 2023-03-27 DIAGNOSIS — G4733 Obstructive sleep apnea (adult) (pediatric): Secondary | ICD-10-CM | POA: Diagnosis not present

## 2023-03-27 DIAGNOSIS — H2511 Age-related nuclear cataract, right eye: Secondary | ICD-10-CM | POA: Diagnosis not present

## 2023-03-27 DIAGNOSIS — E1122 Type 2 diabetes mellitus with diabetic chronic kidney disease: Secondary | ICD-10-CM | POA: Diagnosis not present

## 2023-03-27 DIAGNOSIS — N183 Chronic kidney disease, stage 3 unspecified: Secondary | ICD-10-CM | POA: Insufficient documentation

## 2023-03-27 HISTORY — DX: Presence of external hearing-aid: Z97.4

## 2023-03-27 HISTORY — DX: Obstructive sleep apnea (adult) (pediatric): G47.33

## 2023-03-27 HISTORY — DX: Type 2 diabetes mellitus without complications: E11.9

## 2023-03-27 HISTORY — PX: CATARACT EXTRACTION W/PHACO: SHX586

## 2023-03-27 HISTORY — DX: Chronic kidney disease, stage 3 unspecified: N18.30

## 2023-03-27 HISTORY — DX: Fatty (change of) liver, not elsewhere classified: K76.0

## 2023-03-27 LAB — GLUCOSE, CAPILLARY: Glucose-Capillary: 111 mg/dL — ABNORMAL HIGH (ref 70–99)

## 2023-03-27 SURGERY — PHACOEMULSIFICATION, CATARACT, WITH IOL INSERTION
Anesthesia: Monitor Anesthesia Care | Laterality: Right

## 2023-03-27 MED ORDER — FENTANYL CITRATE (PF) 100 MCG/2ML IJ SOLN
INTRAMUSCULAR | Status: DC | PRN
  Administered 2023-03-27: 25 ug via INTRAVENOUS
  Administered 2023-03-27: 50 ug via INTRAVENOUS
  Administered 2023-03-27: 25 ug via INTRAVENOUS

## 2023-03-27 MED ORDER — MIDAZOLAM HCL 2 MG/2ML IJ SOLN
INTRAMUSCULAR | Status: AC
  Filled 2023-03-27: qty 2

## 2023-03-27 MED ORDER — FENTANYL CITRATE (PF) 100 MCG/2ML IJ SOLN
INTRAMUSCULAR | Status: AC
  Filled 2023-03-27: qty 2

## 2023-03-27 MED ORDER — SIGHTPATH DOSE#1 BSS IO SOLN
INTRAOCULAR | Status: DC | PRN
  Administered 2023-03-27: 2 mL

## 2023-03-27 MED ORDER — MIDAZOLAM HCL 2 MG/2ML IJ SOLN
INTRAMUSCULAR | Status: DC | PRN
  Administered 2023-03-27: 2 mg via INTRAVENOUS

## 2023-03-27 MED ORDER — BRIMONIDINE TARTRATE-TIMOLOL 0.2-0.5 % OP SOLN
OPHTHALMIC | Status: DC | PRN
  Administered 2023-03-27: 1 [drp] via OPHTHALMIC

## 2023-03-27 MED ORDER — SIGHTPATH DOSE#1 NA HYALUR & NA CHOND-NA HYALUR IO KIT
PACK | INTRAOCULAR | Status: DC | PRN
  Administered 2023-03-27: 1 via OPHTHALMIC

## 2023-03-27 MED ORDER — TETRACAINE HCL 0.5 % OP SOLN
1.0000 [drp] | OPHTHALMIC | Status: DC | PRN
  Administered 2023-03-27 (×3): 1 [drp] via OPHTHALMIC

## 2023-03-27 MED ORDER — CEFUROXIME OPHTHALMIC INJECTION 1 MG/0.1 ML
INJECTION | OPHTHALMIC | Status: DC | PRN
  Administered 2023-03-27: 1 mg via INTRACAMERAL

## 2023-03-27 MED ORDER — TETRACAINE HCL 0.5 % OP SOLN
OPHTHALMIC | Status: AC
Start: 2023-03-27 — End: ?
  Filled 2023-03-27: qty 4

## 2023-03-27 MED ORDER — ARMC OPHTHALMIC DILATING DROPS
1.0000 | OPHTHALMIC | Status: DC | PRN
  Administered 2023-03-27 (×3): 1 via OPHTHALMIC

## 2023-03-27 MED ORDER — SIGHTPATH DOSE#1 BSS IO SOLN
INTRAOCULAR | Status: DC | PRN
  Administered 2023-03-27: 70 mL via OPHTHALMIC

## 2023-03-27 MED ORDER — SIGHTPATH DOSE#1 BSS IO SOLN
INTRAOCULAR | Status: DC | PRN
  Administered 2023-03-27: 15 mL via INTRAOCULAR

## 2023-03-27 SURGICAL SUPPLY — 9 items
CATARACT SUITE SIGHTPATH (MISCELLANEOUS) ×1
FEE CATARACT SUITE SIGHTPATH (MISCELLANEOUS) ×1 IMPLANT
GLOVE SRG 8 PF TXTR STRL LF DI (GLOVE) ×1 IMPLANT
GLOVE SURG ENC TEXT LTX SZ7.5 (GLOVE) ×1 IMPLANT
GLOVE SURG UNDER POLY LF SZ8 (GLOVE) ×1
LENS IOL TECNIS EYHANCE 21.5 (Intraocular Lens) IMPLANT
NDL FILTER BLUNT 18X1 1/2 (NEEDLE) ×1 IMPLANT
NEEDLE FILTER BLUNT 18X1 1/2 (NEEDLE) ×1
SYR 3ML LL SCALE MARK (SYRINGE) ×1 IMPLANT

## 2023-03-27 NOTE — H&P (Signed)
[facility]   Primary Care Physician:  Lauro Regulus, MD Ophthalmologist: Dr. Lockie Mola  Pre-Procedure History & Physical: HPI:  Aleksy Barrilleaux. is a 72 y.o. male here for ophthalmic surgery.   Past Medical History:  Diagnosis Date   Atrial fibrillation (HCC)    Fatty liver    HLD (hyperlipidemia)    HTN (hypertension)    OSA on CPAP    Sleep apnea    CPAP   Stage 3 chronic kidney disease (HCC)    Type 2 diabetes mellitus (HCC)    Wears hearing aid in both ears     Past Surgical History:  Procedure Laterality Date   COLONOSCOPY     COLONOSCOPY WITH PROPOFOL N/A 04/29/2019   Procedure: COLONOSCOPY WITH PROPOFOL;  Surgeon: Toledo, Boykin Nearing, MD;  Location: [facility] ENDOSCOPY;  Service: Gastroenterology;  Laterality: N/A;   TONSILLECTOMY     VASECTOMY      Prior to Admission medications   Medication Sig Start Date End Date Taking? Authorizing Provider  Coenzyme Q10 (CO Q 10 PO) Take 1 tablet by mouth daily.    Yes [provider]  Dulaglutide (TRULICITY) 1.5 MG/0.5ML SOAJ Inject into the skin once a week.   Yes [provider]  Empagliflozin-metFORMIN HCl ER (SYNJARDY XR) 25-1000 MG TB24 Take by mouth daily.   Yes [provider]  lisinopril-hydrochlorothiazide (PRINZIDE,ZESTORETIC) 10-12.5 MG tablet Take 1 tablet by mouth daily.  05/20/16  Yes [provider]  metoprolol succinate (TOPROL-XL) 100 MG 24 hr tablet Take 100 mg by mouth daily.  12/03/16  Yes [provider]  rivaroxaban (XARELTO) 20 MG TABS tablet 20 mg.  08/05/16  Yes [provider]  rosuvastatin (CRESTOR) 10 MG tablet Take 20 mg by mouth daily. 04/09/16  Yes [provider]  Continuous Blood Gluc Receiver (FREESTYLE LIBRE 14 DAY READER) DEVI See admin instructions. 12/08/18   [provider]  Continuous Blood Gluc Sensor (FREESTYLE LIBRE 14 DAY SENSOR) MISC USE EVERY 14 DAYS FOR GLUCOSE MONITORING 12/08/18   [provider]    Allergies as of 03/12/2023   (No Known Allergies)    Family History  Problem Relation Age of Onset   Diabetes Paternal Grandmother    Dementia Mother    Heart murmur Mother    Stroke Father    Prostate cancer Neg Hx    Kidney cancer Neg Hx    Bladder Cancer Neg Hx     Social History   Socioeconomic History   Marital status: Married    Spouse name: Not on file   Number of children: Not on file   Years of education: Not on file   Highest education level: Not on file  Occupational History   Not on file  Tobacco Use   Smoking status: Never   Smokeless tobacco: Never  Vaping Use   Vaping status: Never Used  Substance and Sexual Activity   Alcohol use: No   Drug use: No   Sexual activity: Yes  Other Topics Concern   Not on file  Social History Narrative   Not on file   Social Determinants of Health   Financial Resource Strain: Low Risk  (01/21/2023)   Received from [facility]   Overall Financial Resource Strain (CARDIA)    Difficulty of Paying Living Expenses: Not hard at all  Food Insecurity: No Food Insecurity (01/21/2023)   Received from [facility]   Hunger Vital Sign  Worried About Programme researcher, broadcasting/film/video in the Last Year: Never true    Ran Out of Food in the Last Year: Never true  Transportation Needs: No Transportation Needs (01/21/2023)   Received from [facility] - Transportation    In the past 12 months, has lack of transportation kept you from medical appointments or from getting medications?: No    Lack of Transportation (Non-Medical): No  Physical Activity: Not on file  Stress: Not on file  Social Connections: Not on file  Intimate Partner Violence: Not on file    Review of Systems: See HPI, otherwise negative ROS  Physical Exam: Ht 6' (1.829 m)   Wt 136.1 kg   BMI 40.69 kg/m  General:   Alert,  pleasant and cooperative in NAD Head:  Normocephalic and  atraumatic. Lungs:  Clear to auscultation.    Heart:  Regular rate and rhythm.   Impression/Plan: Loyal Jacobson. is here for ophthalmic surgery.  Risks, benefits, limitations, and alternatives regarding ophthalmic surgery have been reviewed with the patient.  Questions have been answered.  All parties agreeable.   Lockie Mola, MD  03/27/2023, 8:04 AM

## 2023-03-27 NOTE — Anesthesia Postprocedure Evaluation (Signed)
Anesthesia Post Note  Patient: Nicholas Oliver.  Procedure(s) Performed: CATARACT EXTRACTION PHACO AND INTRAOCULAR LENS PLACEMENT (IOC) RIGHT DIABETIC 8.49 00:44.9 (Right)  Patient location during evaluation: PACU Anesthesia Type: MAC Level of consciousness: awake and alert Pain management: pain level controlled Vital Signs Assessment: post-procedure vital signs reviewed and stable Respiratory status: spontaneous breathing, nonlabored ventilation, respiratory function stable and patient connected to nasal cannula oxygen Cardiovascular status: stable and blood pressure returned to baseline Postop Assessment: no apparent nausea or vomiting Anesthetic complications: no   No notable events documented.   Last Vitals:  Vitals:   03/27/23 0905 03/27/23 0910  BP: 122/78 135/78  Pulse: 72 71  Resp: 16 15  Temp: 36.5 C 36.5 C  SpO2: 94% 94%    Last Pain:  Vitals:   03/27/23 0910  TempSrc:   PainSc: 0-No pain                 Bevely Palmer Rozell Kettlewell

## 2023-03-27 NOTE — Transfer of Care (Signed)
Immediate Anesthesia Transfer of Care Note  Patient: Nicholas Oliver.  Procedure(s) Performed: CATARACT EXTRACTION PHACO AND INTRAOCULAR LENS PLACEMENT (IOC) RIGHT DIABETIC 8.49 00:44.9 (Right)  Patient Location: PACU  Anesthesia Type: MAC  Level of Consciousness: awake, alert  and patient cooperative  Airway and Oxygen Therapy: Patient Spontanous Breathing and Patient connected to supplemental oxygen  Post-op Assessment: Post-op Vital signs reviewed, Patient's Cardiovascular Status Stable, Respiratory Function Stable, Patent Airway and No signs of Nausea or vomiting  Post-op Vital Signs: Reviewed and stable  Complications: No notable events documented.

## 2023-03-27 NOTE — Op Note (Signed)
LOCATION:  [facility]   PREOPERATIVE DIAGNOSIS:    Nuclear sclerotic cataract right eye. H25.11   POSTOPERATIVE DIAGNOSIS:  Nuclear sclerotic cataract right eye.     PROCEDURE:  Phacoemusification with posterior chamber intraocular lens placement of the right eye   ULTRASOUND TIME: Procedure(s): CATARACT EXTRACTION PHACO AND INTRAOCULAR LENS PLACEMENT (IOC) RIGHT DIABETIC 8.49 00:44.9 (Right)  LENS:   Implant Name Type Inv. Item Serial No. Manufacturer Lot No. LRB No. Used Action  LENS IOL TECNIS EYHANCE 21.5 - [id number] Intraocular Lens LENS IOL TECNIS EYHANCE 21.5 [id number] [employer]  Right 1 Implanted         SURGEON:  Deirdre Evener, MD   ANESTHESIA:  Topical with tetracaine drops and 2% Xylocaine jelly, augmented with 1% preservative-free intracameral lidocaine.    COMPLICATIONS:  None.   DESCRIPTION OF PROCEDURE:  The patient was identified in the holding room and transported to the operating room and placed in the supine position under the operating microscope.  The right eye was identified as the operative eye and it was prepped and draped in the usual sterile ophthalmic fashion.   A 1 millimeter clear-corneal paracentesis was made at the 12:00 position.  0.5 ml of preservative-free 1% lidocaine was injected into the anterior chamber. The anterior chamber was filled with Viscoat viscoelastic.  A 2.4 millimeter keratome was used to make a near-clear corneal incision at the 9:00 position.  A curvilinear capsulorrhexis was made with a cystotome and capsulorrhexis forceps.  Balanced salt solution was used to hydrodissect and hydrodelineate the nucleus.   Phacoemulsification was then used in stop and chop fashion to remove the lens nucleus and epinucleus.  The remaining cortex was then removed using the irrigation and aspiration handpiece. Provisc was then placed into the capsular bag to distend it for lens placement.  A lens was then injected into the capsular  bag.  The remaining viscoelastic was aspirated.   Wounds were hydrated with balanced salt solution.  The anterior chamber was inflated to a physiologic pressure with balanced salt solution.  No wound leaks were noted. Cefuroxime 0.1 ml of a 10mg /ml solution was injected into the anterior chamber for a dose of 1 mg of intracameral antibiotic at the completion of the case.   Timolol and Brimonidine drops were applied to the eye.  The patient was taken to the recovery room in stable condition without complications of anesthesia or surgery.   Nicholas Oliver 03/27/2023, 9:04 AM

## 2023-03-28 ENCOUNTER — Encounter: Payer: Self-pay | Admitting: Ophthalmology

## 2023-04-15 ENCOUNTER — Other Ambulatory Visit: Payer: Self-pay

## 2023-04-15 ENCOUNTER — Encounter: Payer: Self-pay | Admitting: Ophthalmology

## 2023-04-15 NOTE — Discharge Instructions (Signed)

## 2023-04-17 ENCOUNTER — Encounter: Payer: Self-pay | Admitting: Ophthalmology

## 2023-04-17 ENCOUNTER — Other Ambulatory Visit: Payer: Self-pay

## 2023-04-17 ENCOUNTER — Ambulatory Visit: Payer: Self-pay | Admitting: Anesthesiology

## 2023-04-17 ENCOUNTER — Ambulatory Visit: Payer: Medicare Other | Admitting: Anesthesiology

## 2023-04-17 ENCOUNTER — Encounter
Admission: RE | Disposition: A | Discharge status: Home / Self Care | Payer: Self-pay | Source: Home / Self Care | Attending: Ophthalmology

## 2023-04-17 ENCOUNTER — Ambulatory Visit
Admission: RE | Admit: 2023-04-17 | Discharge: 2023-04-17 | Disposition: A | Discharge status: Home / Self Care | Payer: Medicare Other | Attending: Ophthalmology | Admitting: Ophthalmology

## 2023-04-17 DIAGNOSIS — I4891 Unspecified atrial fibrillation: Secondary | ICD-10-CM | POA: Diagnosis not present

## 2023-04-17 DIAGNOSIS — E1122 Type 2 diabetes mellitus with diabetic chronic kidney disease: Secondary | ICD-10-CM | POA: Insufficient documentation

## 2023-04-17 DIAGNOSIS — E1136 Type 2 diabetes mellitus with diabetic cataract: Secondary | ICD-10-CM | POA: Insufficient documentation

## 2023-04-17 DIAGNOSIS — H2512 Age-related nuclear cataract, left eye: Secondary | ICD-10-CM | POA: Diagnosis not present

## 2023-04-17 DIAGNOSIS — N183 Chronic kidney disease, stage 3 unspecified: Secondary | ICD-10-CM | POA: Diagnosis not present

## 2023-04-17 DIAGNOSIS — G4733 Obstructive sleep apnea (adult) (pediatric): Secondary | ICD-10-CM | POA: Diagnosis not present

## 2023-04-17 DIAGNOSIS — Z7984 Long term (current) use of oral hypoglycemic drugs: Secondary | ICD-10-CM | POA: Diagnosis not present

## 2023-04-17 DIAGNOSIS — E785 Hyperlipidemia, unspecified: Secondary | ICD-10-CM | POA: Diagnosis not present

## 2023-04-17 DIAGNOSIS — Z833 Family history of diabetes mellitus: Secondary | ICD-10-CM | POA: Diagnosis not present

## 2023-04-17 DIAGNOSIS — Z7985 Long-term (current) use of injectable non-insulin antidiabetic drugs: Secondary | ICD-10-CM | POA: Insufficient documentation

## 2023-04-17 DIAGNOSIS — I129 Hypertensive chronic kidney disease with stage 1 through stage 4 chronic kidney disease, or unspecified chronic kidney disease: Secondary | ICD-10-CM | POA: Diagnosis not present

## 2023-04-17 DIAGNOSIS — Z79899 Other long term (current) drug therapy: Secondary | ICD-10-CM | POA: Insufficient documentation

## 2023-04-17 HISTORY — PX: CATARACT EXTRACTION W/PHACO: SHX586

## 2023-04-17 LAB — GLUCOSE, CAPILLARY: Glucose-Capillary: 131 mg/dL — ABNORMAL HIGH (ref 70–99)

## 2023-04-17 SURGERY — PHACOEMULSIFICATION, CATARACT, WITH IOL INSERTION
Anesthesia: Monitor Anesthesia Care | Site: Eye | Laterality: Left

## 2023-04-17 MED ORDER — SIGHTPATH DOSE#1 BSS IO SOLN
INTRAOCULAR | Status: DC | PRN
  Administered 2023-04-17: 2 mL

## 2023-04-17 MED ORDER — FENTANYL CITRATE (PF) 100 MCG/2ML IJ SOLN
INTRAMUSCULAR | Status: AC
  Filled 2023-04-17: qty 2

## 2023-04-17 MED ORDER — SIGHTPATH DOSE#1 BSS IO SOLN
INTRAOCULAR | Status: DC | PRN
  Administered 2023-04-17: 71 mL via OPHTHALMIC

## 2023-04-17 MED ORDER — SIGHTPATH DOSE#1 BSS IO SOLN
INTRAOCULAR | Status: DC | PRN
  Administered 2023-04-17: 15 mL via INTRAOCULAR

## 2023-04-17 MED ORDER — MIDAZOLAM HCL 2 MG/2ML IJ SOLN
INTRAMUSCULAR | Status: AC
  Filled 2023-04-17: qty 2

## 2023-04-17 MED ORDER — CEFUROXIME OPHTHALMIC INJECTION 1 MG/0.1 ML
INJECTION | OPHTHALMIC | Status: DC | PRN
  Administered 2023-04-17: 1 mg via INTRACAMERAL

## 2023-04-17 MED ORDER — SODIUM CHLORIDE 0.9% FLUSH: 10.0000 mL | Freq: Two times a day (BID) | INTRAVENOUS | Status: DC

## 2023-04-17 MED ORDER — MIDAZOLAM HCL 2 MG/2ML IJ SOLN
INTRAMUSCULAR | Status: DC | PRN
  Administered 2023-04-17: 2 mg via INTRAVENOUS

## 2023-04-17 MED ORDER — TETRACAINE HCL 0.5 % OP SOLN
OPHTHALMIC | Status: AC
  Filled 2023-04-17: qty 4

## 2023-04-17 MED ORDER — FENTANYL CITRATE (PF) 100 MCG/2ML IJ SOLN
INTRAMUSCULAR | Status: DC | PRN
  Administered 2023-04-17 (×2): 50 ug via INTRAVENOUS

## 2023-04-17 MED ORDER — ARMC OPHTHALMIC DILATING DROPS
1.0000 | OPHTHALMIC | Status: DC | PRN
  Administered 2023-04-17 (×3): 1 via OPHTHALMIC

## 2023-04-17 MED ORDER — SIGHTPATH DOSE#1 NA HYALUR & NA CHOND-NA HYALUR IO KIT
PACK | INTRAOCULAR | Status: DC | PRN
  Administered 2023-04-17: 1 via OPHTHALMIC

## 2023-04-17 MED ORDER — BRIMONIDINE TARTRATE-TIMOLOL 0.2-0.5 % OP SOLN
OPHTHALMIC | Status: DC | PRN
  Administered 2023-04-17: 1 [drp] via OPHTHALMIC

## 2023-04-17 MED ORDER — TETRACAINE HCL 0.5 % OP SOLN
1.0000 [drp] | OPHTHALMIC | Status: DC | PRN
  Administered 2023-04-17 (×3): 1 [drp] via OPHTHALMIC

## 2023-04-17 SURGICAL SUPPLY — 8 items
CATARACT SUITE SIGHTPATH (MISCELLANEOUS) ×1
FEE CATARACT SUITE SIGHTPATH (MISCELLANEOUS) ×1 IMPLANT
GLOVE SRG 8 PF TXTR STRL LF DI (GLOVE) ×1 IMPLANT
GLOVE SURG ENC TEXT LTX SZ7.5 (GLOVE) ×1 IMPLANT
LENS IOL TECNIS EYHANCE 22.0 (Intraocular Lens) IMPLANT
NDL FILTER BLUNT 18X1 1/2 (NEEDLE) ×1 IMPLANT
NEEDLE FILTER BLUNT 18X1 1/2 (NEEDLE) ×1
SYR 3ML LL SCALE MARK (SYRINGE) ×1 IMPLANT

## 2023-04-17 NOTE — H&P (Signed)
[facility]   Primary Care Physician:  Lauro Regulus, MD Ophthalmologist: Dr. Lockie Mola  Pre-Procedure History & Physical: HPI:  Nicholas Oliver. is a 72 y.o. male here for ophthalmic surgery.   Past Medical History:  Diagnosis Date   Atrial fibrillation (HCC)    Fatty liver    HLD (hyperlipidemia)    HTN (hypertension)    OSA on CPAP    Sleep apnea    CPAP   Stage 3 chronic kidney disease (HCC)    Type 2 diabetes mellitus (HCC)    Wears hearing aid in both ears     Past Surgical History:  Procedure Laterality Date   CATARACT EXTRACTION W/PHACO Right 03/27/2023   Procedure: CATARACT EXTRACTION PHACO AND INTRAOCULAR LENS PLACEMENT (IOC) RIGHT DIABETIC 8.49 00:44.9;  Surgeon: Lockie Mola, MD;  Location: [facility];  Service: Ophthalmology;  Laterality: Right;   COLONOSCOPY     COLONOSCOPY WITH PROPOFOL N/A 04/29/2019   Procedure: COLONOSCOPY WITH PROPOFOL;  Surgeon: Toledo, Boykin Nearing, MD;  Location: [facility];  Service: Gastroenterology;  Laterality: N/A;   TONSILLECTOMY     VASECTOMY      Prior to Admission medications   Medication Sig Start Date End Date Taking? Authorizing Provider  Coenzyme Q10 (CO Q 10 PO) Take 1 tablet by mouth daily.    Yes [provider]  Dulaglutide (TRULICITY) 1.5 MG/0.5ML SOAJ Inject into the skin once a week.   Yes [provider]  Empagliflozin-metFORMIN HCl ER (SYNJARDY XR) 25-1000 MG TB24 Take by mouth daily.   Yes [provider]  lisinopril-hydrochlorothiazide (PRINZIDE,ZESTORETIC) 10-12.5 MG tablet Take 1 tablet by mouth daily.  05/20/16  Yes [provider]  metoprolol succinate (TOPROL-XL) 100 MG 24 hr tablet Take 100 mg by mouth daily.  12/03/16  Yes [provider]  rivaroxaban (XARELTO) 20 MG TABS tablet 20 mg.  08/05/16  Yes [provider]  rosuvastatin (CRESTOR) 10 MG tablet Take 20 mg by mouth daily. 04/09/16  Yes [provider]  Continuous Blood Gluc Receiver (FREESTYLE LIBRE 14 DAY READER) DEVI See admin instructions. 12/08/18   [provider]  Continuous Blood Gluc Sensor (FREESTYLE LIBRE 14 DAY SENSOR) MISC USE EVERY 14 DAYS FOR GLUCOSE MONITORING 12/08/18   [provider]    Allergies as of 03/12/2023   (No Known Allergies)    Family History  Problem Relation Age of Onset   Diabetes Paternal Grandmother    Dementia Mother    Heart murmur Mother    Stroke Father    Prostate cancer Neg Hx    Kidney cancer Neg Hx    Bladder Cancer Neg Hx     Social History   Socioeconomic History   Marital status: Married    Spouse name: Not on file   Number of children: Not on file   Years of education: Not on file   Highest education level: Not on file  Occupational History   Not on file  Tobacco Use   Smoking status: Never   Smokeless tobacco: Never  Vaping Use   Vaping status: Never Used  Substance and Sexual Activity   Alcohol use: No   Drug use: No   Sexual activity: Yes  Other Topics Concern   Not on file  Social History Narrative   Not on file   Social Determinants of Health   Financial Resource Strain: Low Risk  (01/21/2023)   Received from [facility]   Overall  Financial Resource Strain (CARDIA)    Difficulty of Paying Living Expenses: Not hard at all  Food Insecurity: No Food Insecurity (01/21/2023)   Received from [facility]   Hunger Vital Sign    Worried About Running Out of Food in the Last Year: Never true    Ran Out of Food in the Last Year: Never true  Transportation Needs: No Transportation Needs (01/21/2023)   Received from [facility] - Transportation    In the past 12 months, has lack of transportation kept you from medical appointments or from getting medications?: No    Lack of Transportation (Non-Medical): No  Physical Activity: Not on file  Stress: Not on file  Social  Connections: Not on file  Intimate Partner Violence: Not on file    Review of Systems: See HPI, otherwise negative ROS  Physical Exam: There were no vitals taken for this visit. General:   Alert,  pleasant and cooperative in NAD Head:  Normocephalic and atraumatic. Lungs:  Clear to auscultation.    Heart:  Regular rate and rhythm.   Impression/Plan: Nicholas Oliver. is here for ophthalmic surgery.  Risks, benefits, limitations, and alternatives regarding ophthalmic surgery have been reviewed with the patient.  Questions have been answered.  All parties agreeable.   Lockie Mola, MD  04/17/2023, 9:38 AM

## 2023-04-17 NOTE — Op Note (Signed)
OPERATIVE NOTE  Nicholas Oliver [id number] 04/17/2023   PREOPERATIVE DIAGNOSIS:  Nuclear sclerotic cataract left eye. H25.12   POSTOPERATIVE DIAGNOSIS:    Nuclear sclerotic cataract left eye.     PROCEDURE:  Phacoemusification with posterior chamber intraocular lens placement of the left eye  Ultrasound time: Procedure(s): CATARACT EXTRACTION PHACO AND INTRAOCULAR LENS PLACEMENT (IOC) LEFT DIABETIC 6.97 00:38.2 (Left)  LENS:   Implant Name Type Inv. Item Serial No. Manufacturer Lot No. LRB No. Used Action  LENS IOL TECNIS EYHANCE 22.0 - [id number] Intraocular Lens LENS IOL TECNIS EYHANCE 22.0 [id number] [employer]  Left 1 Implanted      SURGEON:  Deirdre Evener, MD   ANESTHESIA:  Topical with tetracaine drops and 2% Xylocaine jelly, augmented with 1% preservative-free intracameral lidocaine.    COMPLICATIONS:  None.   DESCRIPTION OF PROCEDURE:  The patient was identified in the holding room and transported to the operating room and placed in the supine position under the operating microscope.  The left eye was identified as the operative eye and it was prepped and draped in the usual sterile ophthalmic fashion.   A 1 millimeter clear-corneal paracentesis was made at the 1:30 position.  0.5 ml of preservative-free 1% lidocaine was injected into the anterior chamber.  The anterior chamber was filled with Viscoat viscoelastic.  A 2.4 millimeter keratome was used to make a near-clear corneal incision at the 10:30 position.  .  A curvilinear capsulorrhexis was made with a cystotome and capsulorrhexis forceps.  Balanced salt solution was used to hydrodissect and hydrodelineate the nucleus.   Phacoemulsification was then used in stop and chop fashion to remove the lens nucleus and epinucleus.  The remaining cortex was then removed using the irrigation and aspiration handpiece. Provisc was then placed into the capsular bag to distend it for lens placement.  A lens was then injected  into the capsular bag.  The remaining viscoelastic was aspirated.   Wounds were hydrated with balanced salt solution.  The anterior chamber was inflated to a physiologic pressure with balanced salt solution.  No wound leaks were noted. Cefuroxime 0.1 ml of a 10mg /ml solution was injected into the anterior chamber for a dose of 1 mg of intracameral antibiotic at the completion of the case.   Timolol and Brimonidine drops were applied to the eye.  The patient was taken to the recovery room in stable condition without complications of anesthesia or surgery.  [name] 04/17/2023, 10:22 AM

## 2023-04-17 NOTE — Anesthesia Postprocedure Evaluation (Signed)
Anesthesia Post Note  Patient: Neema Nair.  Procedure(s) Performed: CATARACT EXTRACTION PHACO AND INTRAOCULAR LENS PLACEMENT (IOC) LEFT DIABETIC 6.97 00:38.2 (Left: Eye)  Patient location during evaluation: PACU Anesthesia Type: MAC Level of consciousness: awake and alert Pain management: pain level controlled Vital Signs Assessment: post-procedure vital signs reviewed and stable Respiratory status: spontaneous breathing, nonlabored ventilation, respiratory function stable and patient connected to nasal cannula oxygen Cardiovascular status: stable and blood pressure returned to baseline Postop Assessment: no apparent nausea or vomiting Anesthetic complications: no   No notable events documented.   Last Vitals:  Vitals:   04/17/23 1024 04/17/23 1029  BP: 105/73 138/77  Pulse: 82 86  Resp: 10 14  Temp: (!) 36.1 C (!) 36.1 C  SpO2: 95% 94%    Last Pain:  Vitals:   04/17/23 1029  TempSrc:   PainSc: 0-No pain                 Yevette Edwards

## 2023-04-17 NOTE — Anesthesia Preprocedure Evaluation (Signed)
Anesthesia Evaluation  Patient identified by MRN, date of birth, ID band Patient awake    Reviewed: Allergy & Precautions, H&P , NPO status , Patient's Chart, lab work & pertinent test results, reviewed documented beta blocker date and time   Airway Mallampati: II  TM Distance: >3 FB Neck ROM: full    Dental no notable dental hx. (+) Teeth Intact   Pulmonary neg pulmonary ROS   Pulmonary exam normal breath sounds clear to auscultation       Cardiovascular Exercise Tolerance: Good hypertension, On Medications negative cardio ROS  Rhythm:regular Rate:Normal     Neuro/Psych negative neurological ROS  negative psych ROS   GI/Hepatic negative GI ROS, Neg liver ROS,,,  Endo/Other  negative endocrine ROSdiabetes    Renal/GU Renal disease     Musculoskeletal   Abdominal   Peds  Hematology negative hematology ROS (+)   Anesthesia Other Findings   Reproductive/Obstetrics negative OB ROS                             Anesthesia Physical Anesthesia Plan  ASA: 3  Anesthesia Plan: MAC   Post-op Pain Management:    Induction:   PONV Risk Score and Plan: 1  Airway Management Planned:   Additional Equipment:   Intra-op Plan:   Post-operative Plan:   Informed Consent: I have reviewed the patients History and Physical, chart, labs and discussed the procedure including the risks, benefits and alternatives for the proposed anesthesia with the patient or authorized representative who has indicated his/her understanding and acceptance.       Plan Discussed with: CRNA  Anesthesia Plan Comments:        Anesthesia Quick Evaluation

## 2023-04-17 NOTE — Transfer of Care (Signed)
Immediate Anesthesia Transfer of Care Note  Patient: Nicholas Oliver.  Procedure(s) Performed: CATARACT EXTRACTION PHACO AND INTRAOCULAR LENS PLACEMENT (IOC) LEFT DIABETIC 6.97 00:38.2 (Left: Eye)  Patient Location: PACU  Anesthesia Type: MAC  Level of Consciousness: awake, alert  and patient cooperative  Airway and Oxygen Therapy: Patient Spontanous Breathing and Patient connected to supplemental oxygen  Post-op Assessment: Post-op Vital signs reviewed, Patient's Cardiovascular Status Stable, Respiratory Function Stable, Patent Airway and No signs of Nausea or vomiting  Post-op Vital Signs: Reviewed and stable  Complications: No notable events documented.

## 2023-04-22 ENCOUNTER — Encounter: Payer: Self-pay | Admitting: Ophthalmology

## 2023-08-06 ENCOUNTER — Other Ambulatory Visit: Payer: Self-pay

## 2023-08-06 ENCOUNTER — Emergency Department

## 2023-08-06 ENCOUNTER — Emergency Department
Admission: EM | Admit: 2023-08-06 | Discharge: 2023-08-06 | Disposition: A | Discharge status: Home / Self Care | Attending: Emergency Medicine | Admitting: Emergency Medicine

## 2023-08-06 DIAGNOSIS — I10 Essential (primary) hypertension: Secondary | ICD-10-CM | POA: Diagnosis not present

## 2023-08-06 DIAGNOSIS — R1032 Left lower quadrant pain: Secondary | ICD-10-CM | POA: Diagnosis present

## 2023-08-06 DIAGNOSIS — E119 Type 2 diabetes mellitus without complications: Secondary | ICD-10-CM | POA: Diagnosis not present

## 2023-08-06 DIAGNOSIS — N200 Calculus of kidney: Secondary | ICD-10-CM

## 2023-08-06 DIAGNOSIS — Z7901 Long term (current) use of anticoagulants: Secondary | ICD-10-CM | POA: Diagnosis not present

## 2023-08-06 DIAGNOSIS — N132 Hydronephrosis with renal and ureteral calculous obstruction: Secondary | ICD-10-CM | POA: Insufficient documentation

## 2023-08-06 LAB — CBC
HCT: 55.8 % — ABNORMAL HIGH (ref 39.0–52.0)
Hemoglobin: 18.5 g/dL — ABNORMAL HIGH (ref 13.0–17.0)
MCH: 29.3 pg (ref 26.0–34.0)
MCHC: 33.2 g/dL (ref 30.0–36.0)
MCV: 88.3 fL (ref 80.0–100.0)
Platelets: 319 10*3/uL (ref 150–400)
RBC: 6.32 MIL/uL — ABNORMAL HIGH (ref 4.22–5.81)
RDW: 14 % (ref 11.5–15.5)
WBC: 17.3 10*3/uL — ABNORMAL HIGH (ref 4.0–10.5)
nRBC: 0 % (ref 0.0–0.2)

## 2023-08-06 LAB — URINALYSIS, ROUTINE W REFLEX MICROSCOPIC
Bilirubin Urine: NEGATIVE
Glucose, UA: >=500 mg/dL — AB
Ketones, ur: NEGATIVE mg/dL
Leukocytes,Ua: NEGATIVE
Nitrite: NEGATIVE
Protein, ur: NEGATIVE mg/dL
Specific Gravity, Urine: >1.046 — ABNORMAL HIGH (ref 1.005–1.030)
pH: 5 (ref 5.0–8.0)

## 2023-08-06 LAB — COMPREHENSIVE METABOLIC PANEL
ALT: 23 U/L (ref 0–44)
AST: 28 U/L (ref 15–41)
Albumin: 4.3 g/dL (ref 3.5–5.0)
Alkaline Phosphatase: 44 U/L (ref 38–126)
Anion gap: 13 (ref 5–15)
BUN: 22 mg/dL (ref 8–23)
CO2: 20 mmol/L — ABNORMAL LOW (ref 22–32)
Calcium: 9.3 mg/dL (ref 8.9–10.3)
Chloride: 102 mmol/L (ref 98–111)
Creatinine, Ser: 1.65 mg/dL — ABNORMAL HIGH (ref 0.61–1.24)
GFR, Estimated: 44 mL/min — ABNORMAL LOW (ref >60)
Glucose, Bld: 137 mg/dL — ABNORMAL HIGH (ref 70–99)
Potassium: 4.4 mmol/L (ref 3.5–5.1)
Sodium: 135 mmol/L (ref 135–145)
Total Bilirubin: 1.1 mg/dL (ref 0.0–1.2)
Total Protein: 7.8 g/dL (ref 6.5–8.1)

## 2023-08-06 LAB — LIPASE, BLOOD: Lipase: 35 U/L (ref 11–51)

## 2023-08-06 MED ORDER — OXYCODONE HCL 5 MG PO TABS: 5.0000 mg | ORAL_TABLET | Freq: Three times a day (TID) | ORAL | 0 refills | Status: AC | PRN

## 2023-08-06 MED ORDER — SODIUM CHLORIDE 0.9 % IV BOLUS
1000.0000 mL | Freq: Once | INTRAVENOUS | Status: AC
  Administered 2023-08-06: 1000 mL via INTRAVENOUS

## 2023-08-06 MED ORDER — IOHEXOL 350 MG/ML SOLN
100.0000 mL | Freq: Once | INTRAVENOUS | Status: AC | PRN
  Administered 2023-08-06: 80 mL via INTRAVENOUS

## 2023-08-06 MED ORDER — ONDANSETRON 4 MG PO TBDP: 4.0000 mg | ORAL_TABLET | Freq: Three times a day (TID) | ORAL | 0 refills | Status: AC | PRN

## 2023-08-06 MED ORDER — TAMSULOSIN HCL 0.4 MG PO CAPS
0.4000 mg | ORAL_CAPSULE | Freq: Every day | ORAL | 0 refills | Status: AC
Start: 2023-08-06 — End: 2023-08-13

## 2023-08-06 NOTE — Discharge Instructions (Addendum)
 You were seen in the emergency department and diagnosed with a kidney stone.  It is importantly follow-up closely with urology.  Return to the emergency department if you have worsening symptoms.  Pain control:  Acetaminophen (tylenol) - You can take 2 extra strength tablets (1000 mg) every 6 hours as needed for pain/fever.    If you are still hurting you can take an opioid pain medication, only take if you are in severe pain.  oxycodone- you were given a prescription for narcotic pain medications.   These are very addictive medications.  These medications can make you constipated.  If you need to take more than 1-2 doses, start a stool softner.  If you become constipated, take 1-2 capfull of MiraLAX mixed in 16 oz of water, can repeat untill having regular bowel movements.  Keep this medication out of reach of any children.  Flomax (tamsulosin) -this medication will make you urinate more frequently, it is importantly stay hydrated with water while taking this medication  zofran (ondansetron) - nausea medication, take 1 tablet every 8 hours as needed for nausea/vomiting.

## 2023-08-06 NOTE — ED Triage Notes (Signed)
 Pt comes with c/o diverticulitis. Pt states he was recently seen for same few weeks ago. Pt states today it started after breakfast. Pt states left lower pain. Pt denies any N/V

## 2023-08-06 NOTE — ED Provider Notes (Signed)
 [facility] Provider Note    Event Date/Time   First MD Initiated Contact with Patient 08/06/23 1936     (approximate)   History   Diverticulitis   HPI  Billy Turvey. is a 73 y.o. male past medical history significant for atrial fibrillation on Xarelto, hypertension, hyperlipidemia, OSA, diabetes, obesity, who presents to the emergency department with left lower abdominal pain.  Patient was evaluated 2 weeks ago and diagnosed with acute uncomplicated diverticulitis and started on Augmentin based on physical exam.  Patient did not have a CT scan done at that time.  States that he was in his normal state of health and doing much better until today when he started having abdominal pain again to his left lower abdomen.  Associated with some nausea.  Denies fever or chills.  No dysuria, urinary urgency or frequency.  Denies any history of kidney stone.  No prior abdominal surgery.     Physical Exam   Triage Vital Signs: ED Triage Vitals  Encounter Vitals Group     BP 08/06/23 1820 137/71     Systolic BP Percentile --      Diastolic BP Percentile --      Pulse Rate 08/06/23 1820 90     Resp 08/06/23 1820 18     Temp 08/06/23 1820 98.3 F (36.8 C)     Temp src --      SpO2 08/06/23 1820 99 %     Weight 08/06/23 1819 300 lb (136.1 kg)     Height 08/06/23 1819 6' (1.829 m)     Head Circumference --      Peak Flow --      Pain Score 08/06/23 1819 10     Pain Loc --      Pain Education --      Exclude from Growth Chart --     Most recent vital signs: Vitals:   08/06/23 1820  BP: 137/71  Pulse: 90  Resp: 18  Temp: 98.3 F (36.8 C)  SpO2: 99%    Physical Exam Constitutional:      Appearance: He is well-developed.  HENT:     Head: Atraumatic.  Eyes:     Conjunctiva/sclera: Conjunctivae normal.  Cardiovascular:     Rate and Rhythm: Regular rhythm.  Pulmonary:     Effort: No respiratory distress.  Abdominal:     Tenderness: There is  abdominal tenderness (Right lower quadrant with no rebound or guarding.).  Musculoskeletal:     Cervical back: Normal range of motion.     Right lower leg: No edema.     Left lower leg: No edema.  Skin:    General: Skin is warm.     Capillary Refill: Capillary refill takes less than 2 seconds.  Neurological:     Mental Status: He is alert. Mental status is at baseline.     IMPRESSION / MDM / ASSESSMENT AND PLAN / ED COURSE  I reviewed the triage vital signs and the nursing notes.  On chart review patient had a diagnosis of acute uncomplicated diverticulitis and started on Augmentin after being evaluated in urgent care approximately 2 weeks ago.  Had an abdominal x-ray and lab work done at that time.  Differential diagnosis including diverticulitis, intra-abdominal abscess, appendicitis, bowel obstruction, constipation, urinary tract infection, pyelonephritis, kidney stone, malignancy  No tachycardic or bradycardic dysrhythmias while on cardiac telemetry.  RADIOLOGY  CT scan abdomen and pelvis with contrast  LABS (all labs ordered  are listed, but only abnormal results are displayed) Labs interpreted as -    Labs Reviewed  COMPREHENSIVE METABOLIC PANEL - Abnormal; Notable for the following components:      Result Value   CO2 20 (*)    Glucose, Bld 137 (*)    Creatinine, Ser 1.65 (*)    GFR, Estimated 44 (*)    All other components within normal limits  CBC - Abnormal; Notable for the following components:   WBC 17.3 (*)    RBC 6.32 (*)    Hemoglobin 18.5 (*)    HCT 55.8 (*)    All other components within normal limits  URINALYSIS, ROUTINE W REFLEX MICROSCOPIC - Abnormal; Notable for the following components:   Color, Urine YELLOW (*)    APPearance CLEAR (*)    Specific Gravity, Urine >1.046 (*)    Glucose, UA >=500 (*)    Hgb urine dipstick SMALL (*)    Bacteria, UA RARE (*)    All other components within normal limits  LIPASE, BLOOD     MDM  Lab work with  significant leukocytosis up to 17.  Also found to have acute kidney injury -most recent creatinine that I have to compare is 2019.  Plan for CT scan.  Given IV fluids.  Declined for any pain medication or antiemetics at this time.  CT scan with findings of 1 to 2 mm distal kidney stone with mild hydronephrosis.  Diverticulosis but no signs of diverticulitis.  On reevaluation patient states he is feeling much better and pain is controlled.  No signs of urinary tract infection.  Patient does have leukocytosis but no obvious source of an infectious process.  States that he feels better and wants to go home.  Plan to follow-up with urology.  Discussed symptomatic treatment and return precautions.  No questions at time of discharge.     PROCEDURES:  Critical Care performed: No  Procedures  Patient's presentation is most consistent with acute presentation with potential threat to life or bodily function.   MEDICATIONS ORDERED IN ED: Medications  sodium chloride 0.9 % bolus 1,000 mL (1,000 mLs Intravenous New Bag/Given 08/06/23 2035)  iohexol (OMNIPAQUE) 350 MG/ML injection 100 mL (80 mLs Intravenous Contrast Given 08/06/23 2015)    FINAL CLINICAL IMPRESSION(S) / ED DIAGNOSES   Final diagnoses:  Kidney stone     Rx / DC Orders   ED Discharge Orders          Ordered    oxyCODONE (ROXICODONE) 5 MG immediate release tablet  Every 8 hours PRN        08/06/23 2152    ondansetron (ZOFRAN-ODT) 4 MG disintegrating tablet  Every 8 hours PRN        08/06/23 2152    tamsulosin (FLOMAX) 0.4 MG CAPS capsule  Daily        08/06/23 2152             Note:  This document was prepared using Dragon voice recognition software and may include unintentional dictation errors.   Corena Herter, MD 08/06/23 2153

## 2024-05-11 ENCOUNTER — Emergency Department

## 2024-05-11 ENCOUNTER — Other Ambulatory Visit: Payer: Self-pay

## 2024-05-11 ENCOUNTER — Emergency Department
Admission: EM | Admit: 2024-05-11 | Discharge: 2024-05-11 | Disposition: A | Discharge status: Home / Self Care | Attending: Emergency Medicine | Admitting: Emergency Medicine

## 2024-05-11 DIAGNOSIS — E1122 Type 2 diabetes mellitus with diabetic chronic kidney disease: Secondary | ICD-10-CM | POA: Insufficient documentation

## 2024-05-11 DIAGNOSIS — I4891 Unspecified atrial fibrillation: Secondary | ICD-10-CM | POA: Insufficient documentation

## 2024-05-11 DIAGNOSIS — I129 Hypertensive chronic kidney disease with stage 1 through stage 4 chronic kidney disease, or unspecified chronic kidney disease: Secondary | ICD-10-CM | POA: Diagnosis not present

## 2024-05-11 DIAGNOSIS — N182 Chronic kidney disease, stage 2 (mild): Secondary | ICD-10-CM | POA: Diagnosis not present

## 2024-05-11 DIAGNOSIS — K5900 Constipation, unspecified: Secondary | ICD-10-CM | POA: Insufficient documentation

## 2024-05-11 DIAGNOSIS — R109 Unspecified abdominal pain: Secondary | ICD-10-CM | POA: Diagnosis present

## 2024-05-11 MED ORDER — LACTULOSE 10 GM/15ML PO SOLN: 10.0000 g | Freq: Three times a day (TID) | ORAL | 1 refills | Status: AC

## 2024-05-11 MED ORDER — BISACODYL 10 MG RE SUPP: 10.0000 mg | RECTAL | 0 refills | Status: AC | PRN

## 2024-05-11 NOTE — Discharge Instructions (Signed)
 Follow-up with drainage and if any continued problems or concerns.  A prescription for Dulcolax suppositories was sent to the pharmacy to use as needed.  The Chronulac  is to be taken 3 times a day with fluids.  You may continue taking Senokot 1 to 2 tablets twice daily.

## 2024-05-11 NOTE — ED Provider Notes (Signed)
 "  [facility] Provider Note    Event Date/Time   First MD Initiated Contact with Patient 05/11/24 [phone]     (approximate)   History   Abdominal Pain   HPI  Nicholas Oliver. is a 73 y.o. male   presents to the ED with complaint abdominal pain that started a week ago.  Patient states that her last BM was 5 days ago.  Patient has taken Senokot 1 tablet for the last 2 days without any relief.  Currently he complains of some nausea but denies any fever or chills.  Patient has a history of A-fib, hypertension, diabetes type 2, CKD, diverticulitis and sleep apnea.      Physical Exam   Triage Vital Signs: ED Triage Vitals  Encounter Vitals Group     BP 05/11/24 0839 129/79     Girls Systolic BP Percentile --      Girls Diastolic BP Percentile --      Boys Systolic BP Percentile --      Boys Diastolic BP Percentile --      Pulse Rate 05/11/24 0839 81     Resp 05/11/24 0839 18     Temp 05/11/24 0839 98.2 F (36.8 C)     Temp src --      SpO2 05/11/24 0839 92 %     Weight 05/11/24 0838 298 lb (135.2 kg)     Height 05/11/24 0838 6' (1.829 m)     Head Circumference --      Peak Flow --      Pain Score 05/11/24 0838 8     Pain Loc --      Pain Education --      Exclude from Growth Chart --     Most recent vital signs: Vitals:   05/11/24 0839  BP: 129/79  Pulse: 81  Resp: 18  Temp: 98.2 F (36.8 C)  SpO2: 92%     General: Awake, no distress.  CV:  Good peripheral perfusion.  Heart regular rate rhythm. Resp:  Normal effort.  Lungs are clear bilaterally. Abd:  No distention.  Soft, diffusely tender throughout.  Bowel sounds are present x 4 quadrants. Other:     ED Results / Procedures / Treatments   Labs (all labs ordered are listed, but only abnormal results are displayed) Labs Reviewed - No data to display   RADIOLOGY X-ray abdomen 1V images were reviewed by myself independent of radiologist and was positive for stool.  No obstructive  gas pattern.  Official radiology report reviewed.    PROCEDURES:  Critical Care performed:   Procedures   MEDICATIONS ORDERED IN ED: Medications - No data to display   IMPRESSION / MDM / ASSESSMENT AND PLAN / ED COURSE  I reviewed the triage vital signs and the nursing notes.   Differential diagnosis includes, but is not limited to, constipation, obstruction, ileus, abdominal pain.  73 year old male presents to the ED with complaint of constipation that started last week.  He has taken 1 Senokot tablet per day for the last 2 days without any relief.  X-ray was reassuring and patient and wife was made aware that there is no finding suggestive of an obstruction.  A prescription for Dulcolax suppositories and Chronulac  was sent to the pharmacy for him begin using.  He was also encouraged drink lots of fluids.  He may continue with Senokot tablets but increase to 3 twice a day.  He is to follow-up with his PCP  if any continued problems or concerns.      Patient's presentation is most consistent with acute complicated illness / injury requiring diagnostic workup.  FINAL CLINICAL IMPRESSION(S) / ED DIAGNOSES   Final diagnoses:  Constipation, unspecified constipation type     Rx / DC Orders   ED Discharge Orders          Ordered    lactulose  (CHRONULAC ) 10 GM/15ML solution  3 times daily        05/11/24 1057    bisacodyl (DULCOLAX) 10 MG suppository  As needed        05/11/24 1057             Note:  This document was prepared using Dragon voice recognition software and may include unintentional dictation errors.   Saunders Shona CROME, PA-C 05/11/24 1136    Arlander Charleston, MD 05/11/24 1215  "

## 2024-05-11 NOTE — ED Triage Notes (Signed)
 Pt comes with belly pain that started week ago. Pt states constipation since last Wed. Pt states nausea.
